# Patient Record
Sex: Female | Born: 1965 | Race: Black or African American | Hispanic: No | Marital: Single | Smoking: Never smoker
Health system: Southern US, Community
[De-identification: ages and names within clinical notes are randomized; demographics above are authoritative.]

## PROBLEM LIST (undated history)

## (undated) DIAGNOSIS — I10 Essential (primary) hypertension: Secondary | ICD-10-CM

## (undated) HISTORY — DX: Essential (primary) hypertension: I10

## (undated) HISTORY — PX: BREAST EXCISIONAL BIOPSY: SUR124

---

## 2013-07-10 HISTORY — PX: TOTAL ABDOMINAL HYSTERECTOMY: SHX209

## 2018-10-14 DIAGNOSIS — I10 Essential (primary) hypertension: Secondary | ICD-10-CM | POA: Insufficient documentation

## 2018-10-14 DIAGNOSIS — E1159 Type 2 diabetes mellitus with other circulatory complications: Secondary | ICD-10-CM | POA: Insufficient documentation

## 2018-10-14 DIAGNOSIS — J309 Allergic rhinitis, unspecified: Secondary | ICD-10-CM | POA: Insufficient documentation

## 2018-10-15 ENCOUNTER — Other Ambulatory Visit: Payer: Self-pay | Admitting: Family

## 2018-10-15 DIAGNOSIS — Z1231 Encounter for screening mammogram for malignant neoplasm of breast: Secondary | ICD-10-CM

## 2018-10-17 DIAGNOSIS — E785 Hyperlipidemia, unspecified: Secondary | ICD-10-CM | POA: Insufficient documentation

## 2018-12-17 ENCOUNTER — Other Ambulatory Visit: Payer: Self-pay

## 2018-12-17 ENCOUNTER — Ambulatory Visit
Admission: RE | Admit: 2018-12-17 | Discharge: 2018-12-17 | Disposition: A | Discharge status: Home / Self Care | Payer: Federal, State, Local not specified - PPO | Source: Ambulatory Visit | Attending: Family | Admitting: Family

## 2018-12-17 DIAGNOSIS — Z1231 Encounter for screening mammogram for malignant neoplasm of breast: Secondary | ICD-10-CM | POA: Insufficient documentation

## 2018-12-30 ENCOUNTER — Other Ambulatory Visit: Payer: Self-pay | Admitting: Family

## 2018-12-30 DIAGNOSIS — N6489 Other specified disorders of breast: Secondary | ICD-10-CM

## 2018-12-30 DIAGNOSIS — R928 Other abnormal and inconclusive findings on diagnostic imaging of breast: Secondary | ICD-10-CM

## 2019-01-06 ENCOUNTER — Ambulatory Visit
Admission: RE | Admit: 2019-01-06 | Discharge: 2019-01-06 | Disposition: A | Discharge status: Home / Self Care | Payer: Federal, State, Local not specified - PPO | Source: Ambulatory Visit | Attending: Family | Admitting: Family

## 2019-01-06 ENCOUNTER — Other Ambulatory Visit: Payer: Self-pay

## 2019-01-06 DIAGNOSIS — R928 Other abnormal and inconclusive findings on diagnostic imaging of breast: Secondary | ICD-10-CM

## 2019-01-06 DIAGNOSIS — N6489 Other specified disorders of breast: Secondary | ICD-10-CM

## 2019-08-14 ENCOUNTER — Ambulatory Visit: Payer: Federal, State, Local not specified - PPO | Attending: Internal Medicine

## 2019-08-14 DIAGNOSIS — Z20822 Contact with and (suspected) exposure to covid-19: Secondary | ICD-10-CM

## 2019-08-15 LAB — NOVEL CORONAVIRUS, NAA: SARS-CoV-2, NAA: NOT DETECTED

## 2020-04-07 ENCOUNTER — Encounter: Payer: Federal, State, Local not specified - PPO | Admitting: Obstetrics & Gynecology

## 2020-05-04 ENCOUNTER — Other Ambulatory Visit: Payer: Self-pay | Admitting: Internal Medicine

## 2020-05-04 DIAGNOSIS — Z1231 Encounter for screening mammogram for malignant neoplasm of breast: Secondary | ICD-10-CM

## 2020-05-07 ENCOUNTER — Encounter: Payer: Self-pay | Admitting: Obstetrics & Gynecology

## 2020-05-07 ENCOUNTER — Other Ambulatory Visit: Payer: Self-pay

## 2020-05-07 ENCOUNTER — Other Ambulatory Visit (HOSPITAL_COMMUNITY)
Admission: RE | Admit: 2020-05-07 | Discharge: 2020-05-07 | Disposition: A | Discharge status: Home / Self Care | Payer: Federal, State, Local not specified - PPO | Source: Ambulatory Visit | Attending: Obstetrics & Gynecology | Admitting: Obstetrics & Gynecology

## 2020-05-07 ENCOUNTER — Ambulatory Visit (INDEPENDENT_AMBULATORY_CARE_PROVIDER_SITE_OTHER): Payer: Federal, State, Local not specified - PPO | Admitting: Obstetrics & Gynecology

## 2020-05-07 VITALS — BP 139/92 | HR 81 | Ht 63.0 in | Wt 232.1 lb

## 2020-05-07 DIAGNOSIS — Z01419 Encounter for gynecological examination (general) (routine) without abnormal findings: Secondary | ICD-10-CM | POA: Diagnosis present

## 2020-05-07 NOTE — Progress Notes (Signed)
Subjective:     Kimberly Benson is a 54 y.o. female here for a routine exam.  Current complaints: Pt moved here from [place]. Her mother is a pt here. She brother and sister both moved to the area. She has 7 nieces here as well. She denies GYN issues. She had a hysterectomy several years ago. Seh does not recall the date or what type of hysterectomy it was. She does know that one of her ovaries was removed. Pt is divorced. She does report hot flushes that are managed conservatively.      Gynecologic History No LMP recorded. Patient has had a hysterectomy. Contraception: status post hysterectomy Last Pap: unknown. Last mammogram: unknown. Has appt for next week.   Obstetric History OB History  Gravida Para Term Preterm AB Living  0 0 0 0 0 0  SAB TAB Ectopic Multiple Live Births  0 0 0 0 0   The following portions of the patient's history were reviewed and updated as appropriate: allergies, current medications, past family history, past medical history, past social history, past surgical history and problem list.  Review of Systems Pertinent items are noted in HPI.    Objective:  BP (!) 139/92   Pulse 81   Ht 5\' 3"  (1.6 m)   Wt 232 lb 1.9 oz (105.3 kg)   BMI 41.12 kg/m  General Appearance:    Alert, cooperative, no distress, appears stated age  Head:    Normocephalic, without obvious abnormality, atraumatic  Eyes:    conjunctiva/corneas clear, EOM's intact, both eyes  Ears:    Normal external ear canals, both ears  Nose:   Nares normal, septum midline, mucosa normal, no drainage    or sinus tenderness  Throat:   Lips, mucosa, and tongue normal; teeth and gums normal  Neck:   Supple, symmetrical, trachea midline, no adenopathy;    thyroid:  no enlargement/tenderness/nodules  Back:     Symmetric, no curvature, ROM normal, no CVA tenderness  Lungs:     respirations unlabored  Chest Wall:    No tenderness or deformity   Heart:    Regular rate and rhythm  Breast Exam:    No  tenderness, masses, or nipple abnormality  Abdomen:     Soft, non-tender, bowel sounds active all four quadrants,    no masses, no organomegaly  Genitalia:    Normal female without lesion, discharge or tenderness   Pt does have a cervix. A PAP was obtained. I do not feel a uterus. No adnexal masses or tenderness palpated.    Extremities:   Extremities normal, atraumatic, no cyanosis or edema  Pulses:   2+ and symmetric all extremities  Skin:   Skin color, texture, turgor normal, no rashes or lesions    Assessment:    Healthy female exam.   S/p hyst but, has intact cervix. Reviewed cervical cancer screening.    Plan:  F/u PAP with hrHPV F/u for mammogram in 1 year.  F/u in 1 year or sooner prn  Willamae Demby L. Harraway-Smith, M.D., 

## 2020-05-07 NOTE — Progress Notes (Signed)
Pt presents for new gyn visit.  Colonoscopy - 3 years ago Mammogram - 05/14/2020 No pregnancies Hysterectomy - 2015?

## 2020-05-12 LAB — CYTOLOGY - PAP
Comment: NEGATIVE
Diagnosis: NEGATIVE
High risk HPV: NEGATIVE

## 2020-06-11 ENCOUNTER — Other Ambulatory Visit: Payer: Self-pay

## 2020-06-11 ENCOUNTER — Ambulatory Visit
Admission: RE | Admit: 2020-06-11 | Discharge: 2020-06-11 | Disposition: A | Discharge status: Home / Self Care | Payer: Federal, State, Local not specified - PPO | Source: Ambulatory Visit | Attending: Internal Medicine | Admitting: Internal Medicine

## 2020-06-11 DIAGNOSIS — Z1231 Encounter for screening mammogram for malignant neoplasm of breast: Secondary | ICD-10-CM | POA: Insufficient documentation

## 2020-07-20 ENCOUNTER — Telehealth: Payer: Self-pay | Admitting: General Practice

## 2020-07-20 NOTE — Telephone Encounter (Signed)
Release of Information was sent to [facility] on 05/07/2020.  Received fax stating "patient has not been at this facility."

## 2020-10-15 ENCOUNTER — Other Ambulatory Visit (HOSPITAL_BASED_OUTPATIENT_CLINIC_OR_DEPARTMENT_OTHER): Payer: Self-pay

## 2020-10-15 MED ORDER — ZOSTER VAC RECOMB ADJUVANTED 50 MCG/0.5ML IM SUSR
INTRAMUSCULAR | 0 refills | Status: DC
  Filled 2020-10-15: qty 0.5, 1d supply, fill #0

## 2020-10-25 ENCOUNTER — Other Ambulatory Visit (HOSPITAL_BASED_OUTPATIENT_CLINIC_OR_DEPARTMENT_OTHER): Payer: Self-pay

## 2020-11-20 DIAGNOSIS — E1165 Type 2 diabetes mellitus with hyperglycemia: Secondary | ICD-10-CM | POA: Insufficient documentation

## 2020-11-20 DIAGNOSIS — E669 Obesity, unspecified: Secondary | ICD-10-CM | POA: Insufficient documentation

## 2020-12-10 ENCOUNTER — Other Ambulatory Visit (HOSPITAL_COMMUNITY): Payer: Self-pay | Admitting: Podiatry

## 2020-12-10 ENCOUNTER — Other Ambulatory Visit: Payer: Self-pay | Admitting: Podiatry

## 2020-12-10 DIAGNOSIS — M25572 Pain in left ankle and joints of left foot: Secondary | ICD-10-CM

## 2020-12-10 DIAGNOSIS — S93402D Sprain of unspecified ligament of left ankle, subsequent encounter: Secondary | ICD-10-CM

## 2020-12-15 ENCOUNTER — Ambulatory Visit
Admission: RE | Admit: 2020-12-15 | Discharge: 2020-12-15 | Disposition: A | Discharge status: Home / Self Care | Payer: Federal, State, Local not specified - PPO | Source: Ambulatory Visit | Attending: Podiatry | Admitting: Podiatry

## 2020-12-15 ENCOUNTER — Other Ambulatory Visit: Payer: Self-pay

## 2020-12-15 DIAGNOSIS — S93402D Sprain of unspecified ligament of left ankle, subsequent encounter: Secondary | ICD-10-CM | POA: Diagnosis present

## 2020-12-15 DIAGNOSIS — M25572 Pain in left ankle and joints of left foot: Secondary | ICD-10-CM | POA: Diagnosis present

## 2021-01-21 ENCOUNTER — Other Ambulatory Visit (HOSPITAL_BASED_OUTPATIENT_CLINIC_OR_DEPARTMENT_OTHER): Payer: Self-pay

## 2021-01-21 MED ORDER — SHINGRIX 50 MCG/0.5ML IM SUSR
INTRAMUSCULAR | 0 refills | Status: DC
  Filled 2021-01-21: qty 1, 1d supply, fill #0

## 2021-05-16 ENCOUNTER — Other Ambulatory Visit: Payer: Self-pay | Admitting: Internal Medicine

## 2021-05-16 DIAGNOSIS — Z1231 Encounter for screening mammogram for malignant neoplasm of breast: Secondary | ICD-10-CM

## 2021-09-19 ENCOUNTER — Other Ambulatory Visit: Payer: Self-pay

## 2021-09-21 ENCOUNTER — Other Ambulatory Visit: Payer: Self-pay

## 2021-09-21 DIAGNOSIS — Z1211 Encounter for screening for malignant neoplasm of colon: Secondary | ICD-10-CM

## 2021-09-21 MED ORDER — CLENPIQ 10-3.5-12 MG-GM -GM/160ML PO SOLN: 1.0000 | Freq: Once | ORAL | 0 refills | Status: AC

## 2021-09-21 NOTE — Progress Notes (Signed)
Gastroenterology Pre-Procedure Review ? ?Request Date: 10/28/2021 ?Requesting Physician: Dr. Marius Ditch ? ?PATIENT REVIEW QUESTIONS: The patient responded to the following health history questions as indicated:   ? ?1. Are you having any GI issues? no ?2. Do you have a personal history of Polyps?  Yes, few polyps removed. Last colonoscopy 5 yrs. Ago in [place] ?3. Do you have a family history of Colon Cancer or Polyps? no ?4. Diabetes Mellitus? no ?5. Joint replacements in the past 12 months?no ?6. Major health problems in the past 3 months?no ?7. Any artificial heart valves, MVP, or defibrillator?no ?   ?MEDICATIONS & ALLERGIES:    ?Patient reports the following regarding taking any anticoagulation/antiplatelet therapy:   ?Plavix, Coumadin, Eliquis, Xarelto, Lovenox, Pradaxa, Brilinta, or Effient? no ?Aspirin? no ? ?Patient confirms/reports the following medications:  ?Current Outpatient Medications  ?Medication Sig Dispense Refill  ? Sod Picosulfate-Mag Ox-Cit Acd (CLENPIQ) 10-3.5-12 MG-GM -GM/160ML SOLN Take 1 kit by mouth once for 1 dose. 320 mL 0  ? ALPRAZolam (XANAX) 0.25 MG tablet     ? amLODipine (NORVASC) 5 MG tablet Take 5 mg by mouth daily.    ? AMLODIPINE BENZOATE PO     ? Cholecalciferol (VITAMIN D3) 1.25 MG (50000 UT) CAPS Take 1 capsule by mouth once a week.    ? cholecalciferol (VITAMIN D3) 25 MCG (1000 UNIT) tablet     ? clindamycin-benzoyl peroxide (BENZACLIN) gel     ? desvenlafaxine (PRISTIQ) 50 MG 24 hr tablet Take 50 mg by mouth daily.    ? FLUTICASONE PROPIONATE EX     ? nystatin (MYCOSTATIN/NYSTOP) powder SMARTSIG:1 Topical Every Night    ? OLMESARTAN-AMLODIPINE-HCTZ PO     ? rosuvastatin (CRESTOR) 20 MG tablet Take 20 mg by mouth at bedtime.    ? rosuvastatin (CRESTOR) 40 MG tablet Take 40 mg by mouth daily.    ? Rosuvastatin Calcium 20 MG CPSP     ? Zoster Vaccine Adjuvanted [facility]) injection Inject into the muscle. 0.5 mL 0  ? Zoster Vaccine Adjuvanted [facility]) injection Inject into the muscle.  1 each 0  ? ?No current facility-administered medications for this visit.  ? ? ?Patient confirms/reports the following allergies:  ?No Known Allergies ? ?Orders Placed This Encounter  ?Procedures  ? Procedural/ Surgical Case Request: COLONOSCOPY WITH PROPOFOL  ?  Standing Status:   Standing  ?  Number of Occurrences:   1  ?  Order Specific Question:   Pre-op diagnosis  ?  Answer:   Colon cancer screening Z12.11  ?  Order Specific Question:   CPT Code  ?  Answer:   PR COLONOSCOPY FLX DX W/COLLJ SPEC WHEN PFRMD [45378]  ? ? ?AUTHORIZATION INFORMATION ?Primary Insurance: ?1D#: ?Group #: ? ?Secondary Insurance: ?1D#: ?Group #: ? ?SCHEDULE INFORMATION: ?Date: 10/28/2021 ?Time: ?Location: [facility] ? ?

## 2021-10-27 ENCOUNTER — Other Ambulatory Visit: Payer: Self-pay

## 2021-10-27 DIAGNOSIS — Z1211 Encounter for screening for malignant neoplasm of colon: Secondary | ICD-10-CM

## 2021-11-25 ENCOUNTER — Encounter: Payer: Self-pay | Admitting: Gastroenterology

## 2021-11-25 ENCOUNTER — Ambulatory Visit: Payer: Federal, State, Local not specified - PPO | Admitting: Certified Registered"

## 2021-11-25 ENCOUNTER — Encounter
Admission: RE | Disposition: A | Discharge status: Home / Self Care | Payer: Self-pay | Source: Ambulatory Visit | Attending: Gastroenterology

## 2021-11-25 ENCOUNTER — Ambulatory Visit
Admission: RE | Admit: 2021-11-25 | Discharge: 2021-11-25 | Disposition: A | Discharge status: Home / Self Care | Payer: Federal, State, Local not specified - PPO | Source: Ambulatory Visit | Attending: Gastroenterology | Admitting: Gastroenterology

## 2021-11-25 DIAGNOSIS — Z1211 Encounter for screening for malignant neoplasm of colon: Secondary | ICD-10-CM | POA: Diagnosis present

## 2021-11-25 DIAGNOSIS — I10 Essential (primary) hypertension: Secondary | ICD-10-CM | POA: Diagnosis not present

## 2021-11-25 HISTORY — PX: COLONOSCOPY WITH PROPOFOL: SHX5780

## 2021-11-25 LAB — GLUCOSE, CAPILLARY: Glucose-Capillary: 120 mg/dL — ABNORMAL HIGH (ref 70–99)

## 2021-11-25 SURGERY — COLONOSCOPY WITH PROPOFOL
Anesthesia: General

## 2021-11-25 MED ORDER — PROPOFOL 500 MG/50ML IV EMUL
INTRAVENOUS | Status: DC | PRN
Start: 2021-11-25 — End: 2021-11-25

## 2021-11-25 MED ORDER — SODIUM CHLORIDE 0.9 % IV SOLN
INTRAVENOUS | Status: DC
  Administered 2021-11-25: 1000 mL via INTRAVENOUS

## 2021-11-25 MED ORDER — PROPOFOL 10 MG/ML IV BOLUS
INTRAVENOUS | Status: DC | PRN
Start: 2021-11-25 — End: 2021-11-25

## 2021-11-25 MED ORDER — PROPOFOL 500 MG/50ML IV EMUL
INTRAVENOUS | Status: AC
  Filled 2021-11-25: qty 50

## 2021-11-25 MED ORDER — PROPOFOL 10 MG/ML IV BOLUS
INTRAVENOUS | Status: DC | PRN
  Administered 2021-11-25: 140 ug/kg/min via INTRAVENOUS

## 2021-11-25 MED ORDER — PROPOFOL 500 MG/50ML IV EMUL
INTRAVENOUS | Status: DC | PRN
  Administered 2021-11-25: 50 mg via INTRAVENOUS

## 2021-11-25 NOTE — Anesthesia Procedure Notes (Signed)
Date/Time: 11/25/2021 12:07 PM Performed by: Berniece Pap, CRNA Pre-anesthesia Checklist: Patient identified, Emergency Drugs available, Suction available and Patient being monitored Patient Re-evaluated:Patient Re-evaluated prior to induction Oxygen Delivery Method: Simple face mask Induction Type: IV induction

## 2021-11-25 NOTE — H&P (Signed)
Arlyss Repress, MD [address]  [address]  [place], [place] [address]  Main: [phone]  Fax: [phone] Pager: [phone]  Primary Care Physician:  Lyndon Code, MD Primary Gastroenterologist:  Dr. Arlyss Repress  Pre-Procedure History & Physical: HPI:  Kimberly Benson is a 56 y.o. female is here for an colonoscopy.   Past Medical History:  Diagnosis Date   Hypertension     Past Surgical History:  Procedure Laterality Date   BREAST EXCISIONAL BIOPSY Left 56 yrs old   benign   TOTAL ABDOMINAL HYSTERECTOMY  2015    Prior to Admission medications   Medication Sig Start Date End Date Taking? Authorizing Provider  ALPRAZolam Prudy Feeler) 0.25 MG tablet    Yes [provider]  amLODipine (NORVASC) 5 MG tablet Take 5 mg by mouth daily. 04/19/20  Yes [provider]  AMLODIPINE BENZOATE PO    Yes [provider]  Cholecalciferol (VITAMIN D3) 1.25 MG (50000 UT) CAPS Take 1 capsule by mouth once a week. 04/16/20  Yes [provider]  cholecalciferol (VITAMIN D3) 25 MCG (1000 UNIT) tablet    Yes [provider]  clindamycin-benzoyl peroxide (BENZACLIN) gel  04/14/20  Yes [provider]  desvenlafaxine (PRISTIQ) 50 MG 24 hr tablet Take 50 mg by mouth daily. 04/05/20  Yes [provider]  nystatin (MYCOSTATIN/NYSTOP) powder SMARTSIG:1 Topical Every Night 01/23/20  Yes [provider]  OLMESARTAN-AMLODIPINE-HCTZ PO    Yes [provider]  rosuvastatin (CRESTOR) 20 MG tablet Take 20 mg by mouth at bedtime. 01/23/20  Yes [provider]  FLUTICASONE PROPIONATE EX     [provider]  rosuvastatin (CRESTOR) 40 MG tablet Take 40 mg by mouth daily. 04/22/20   [provider]  Rosuvastatin Calcium 20 MG CPSP     [provider]  Zoster Vaccine Adjuvanted [facility]) injection Inject into the muscle. 10/15/20   Judyann Munson, MD  Zoster Vaccine Adjuvanted [facility]) injection  Inject into the muscle. 01/21/21   Judyann Munson, MD    Allergies as of 09/21/2021   (No Known Allergies)    Family History  Family history unknown: Yes    Social History   Socioeconomic History   Marital status: Single    Spouse name: Not on file   Number of children: Not on file   Years of education: Not on file   Highest education level: Not on file  Occupational History   Not on file  Tobacco Use   Smoking status: Never   Smokeless tobacco: Never  Vaping Use   Vaping Use: Never used  Substance and Sexual Activity   Alcohol use: Yes    Comment: Social    Drug use: Never   Sexual activity: Yes    Birth control/protection: None  Other Topics Concern   Not on file  Social History Narrative   Not on file   Social Determinants of Health   Financial Resource Strain: Not on file  Food Insecurity: Not on file  Transportation Needs: Not on file  Physical Activity: Not on file  Stress: Not on file  Social Connections: Not on file  Intimate Partner Violence: Not on file    Review of Systems: See HPI, otherwise negative ROS  Physical Exam: BP (!) 158/94   Pulse 86   Temp (!) 97.5 F (36.4 C) (Temporal)   Resp 18   Ht 5\' 3"  (1.6 m)   Wt 102.3 kg   SpO2 100%   BMI  39.94 kg/m  General:   Alert,  pleasant and cooperative in NAD Head:  Normocephalic and atraumatic. Neck:  Supple; no masses or thyromegaly. Lungs:  Clear throughout to auscultation.    Heart:  Regular rate and rhythm. Abdomen:  Soft, nontender and nondistended. Normal bowel sounds, without guarding, and without rebound.   Neurologic:  Alert and  oriented x4;  grossly normal neurologically.  Impression/Plan: Kimberly Benson is here for an colonoscopy to be performed for colon cancer screening  Risks, benefits, limitations, and alternatives regarding  colonoscopy have been reviewed with the patient.  Questions have been answered.  All parties agreeable.   Lannette Donath, MD  11/25/2021, 11:33  AM

## 2021-11-25 NOTE — Anesthesia Preprocedure Evaluation (Signed)
Anesthesia Evaluation  Patient identified by MRN, date of birth, ID band Patient awake    Reviewed: Allergy & Precautions, NPO status , Patient's Chart, lab work & pertinent test results  History of Anesthesia Complications Negative for: history of anesthetic complications  Airway Mallampati: III  TM Distance: >3 FB Neck ROM: full    Dental  (+) Chipped   Pulmonary neg pulmonary ROS, neg shortness of breath,    Pulmonary exam normal        Cardiovascular hypertension, (-) anginaNormal cardiovascular exam     Neuro/Psych negative neurological ROS  negative psych ROS   GI/Hepatic negative GI ROS, Neg liver ROS,   Endo/Other  negative endocrine ROS  Renal/GU negative Renal ROS  negative genitourinary   Musculoskeletal   Abdominal   Peds  Hematology negative hematology ROS (+)   Anesthesia Other Findings Past Medical History: No date: Hypertension  Past Surgical History: 56 yrs old: BREAST EXCISIONAL BIOPSY; Left     Comment:  benign 2015: TOTAL ABDOMINAL HYSTERECTOMY  BMI    Body Mass Index: 39.94 kg/m      Reproductive/Obstetrics negative OB ROS                             Anesthesia Physical Anesthesia Plan  ASA: 3  Anesthesia Plan: General   Post-op Pain Management:    Induction: Intravenous  PONV Risk Score and Plan: Propofol infusion and TIVA  Airway Management Planned: Natural Airway and Nasal Cannula  Additional Equipment:   Intra-op Plan:   Post-operative Plan:   Informed Consent: I have reviewed the patients History and Physical, chart, labs and discussed the procedure including the risks, benefits and alternatives for the proposed anesthesia with the patient or authorized representative who has indicated his/her understanding and acceptance.     Dental Advisory Given  Plan Discussed with: Anesthesiologist, CRNA and Surgeon  Anesthesia Plan Comments:  (Patient consented for risks of anesthesia including but not limited to:  - adverse reactions to medications - risk of airway placement if required - damage to eyes, teeth, lips or other oral mucosa - nerve damage due to positioning  - sore throat or hoarseness - Damage to heart, brain, nerves, lungs, other parts of body or loss of life  Patient voiced understanding.)        Anesthesia Quick Evaluation

## 2021-11-25 NOTE — Op Note (Signed)
[facility] Gastroenterology Patient Name: Kimberly Benson Procedure Date: 11/25/2021 11:43 AM MRN: [id number] Account #: [id number] Date of Birth: 1966/04/11 Admit Type: Outpatient Age: 56 Room: [facility] ENDO ROOM 2 Gender: Female Note Status: Finalized Instrument Name: Colonscope [id number] Procedure:             Colonoscopy Indications:           Screening for colorectal malignant neoplasm Providers:             Toney Reil MD, MD Referring MD:          Lyndon Code, MD (Referring MD) Medicines:             General Anesthesia Complications:         No immediate complications. Estimated blood loss: None. Procedure:             Pre-Anesthesia Assessment:                        - Prior to the procedure, a History and Physical was                         performed, and patient medications and allergies were                         reviewed. The patient is competent. The risks and                         benefits of the procedure and the sedation options and                         risks were discussed with the patient. All questions                         were answered and informed consent was obtained.                         Patient identification and proposed procedure were                         verified by the physician, the nurse, the                         anesthesiologist, the anesthetist and the technician                         in the pre-procedure area in the procedure room in the                         endoscopy suite. Mental Status Examination: alert and                         oriented. Airway Examination: normal oropharyngeal                         airway and neck mobility. Respiratory Examination:                         clear to auscultation. CV Examination: normal.  Prophylactic Antibiotics: The patient does not require                         prophylactic antibiotics. Prior Anticoagulants: The                          patient has taken no previous anticoagulant or                         antiplatelet agents. ASA Grade Assessment: III - A                         patient with severe systemic disease. After reviewing                         the risks and benefits, the patient was deemed in                         satisfactory condition to undergo the procedure. The                         anesthesia plan was to use general anesthesia.                         Immediately prior to administration of medications,                         the patient was re-assessed for adequacy to receive                         sedatives. The heart rate, respiratory rate, oxygen                         saturations, blood pressure, adequacy of pulmonary                         ventilation, and response to care were monitored                         throughout the procedure. The physical status of the                         patient was re-assessed after the procedure.                        After obtaining informed consent, the colonoscope was                         passed under direct vision. Throughout the procedure,                         the patient's blood pressure, pulse, and oxygen                         saturations were monitored continuously. The                         Colonoscope was introduced through the anus and  advanced to the the cecum, identified by appendiceal                         orifice and ileocecal valve. The colonoscopy was                         performed without difficulty. The patient tolerated                         the procedure well. The quality of the bowel                         preparation was evaluated using the BBPS [facility] Bowel                         Preparation Scale) with scores of: Right Colon = 3,                         Transverse Colon = 3 and Left Colon = 3 (entire mucosa                         seen well with no residual staining, small fragments                          of stool or opaque liquid). The total BBPS score                         equals 9. Findings:      The perianal and digital rectal examinations were normal. Pertinent       negatives include normal sphincter tone and no palpable rectal lesions.      The entire examined colon appeared normal.      The retroflexed view of the distal rectum and anal verge was normal and       showed no anal or rectal abnormalities. Impression:            - The entire examined colon is normal.                        - The distal rectum and anal verge are normal on                         retroflexion view.                        - No specimens collected. Recommendation:        - Discharge patient to home (with escort).                        - Resume previous diet today.                        - Continue present medications.                        - Repeat colonoscopy in 10 years for screening                         purposes. Procedure Code(s):     ---  Professional ---                        G4010, Colorectal cancer screening; colonoscopy on                         individual not meeting criteria for high risk Diagnosis Code(s):     --- Professional ---                        Z12.11, Encounter for screening for malignant neoplasm                         of colon CPT copyright 2019 American Medical Association. All rights reserved. The codes documented in this report are preliminary and upon coder review may  be revised to meet current compliance requirements. Dr. Libby Maw Toney Reil MD, MD 11/25/2021 12:12:31 PM This report has been signed electronically. Number of Addenda: 0 Note Initiated On: 11/25/2021 11:43 AM Scope Withdrawal Time: 0 hours 5 minutes 58 seconds  Total Procedure Duration: 0 hours 10 minutes 26 seconds  Estimated Blood Loss:  Estimated blood loss: none.      [facility]

## 2021-11-25 NOTE — Transfer of Care (Signed)
Immediate Anesthesia Transfer of Care Note  Patient: Kimberly Benson  Procedure(s) Performed: COLONOSCOPY WITH PROPOFOL  Patient Location: Endoscopy Unit  Anesthesia Type:General  Level of Consciousness: awake, alert  and oriented  Airway & Oxygen Therapy: Patient Spontanous Breathing  Post-op Assessment: Report given to RN and Post -op Vital signs reviewed and stable  Post vital signs: Reviewed and stable  Last Vitals:  Vitals Value Taken Time  BP 125/75 11/25/21 1217  Temp    Pulse 84 11/25/21 1219  Resp 17 11/25/21 1219  SpO2 100 % 11/25/21 1219  Vitals shown include unvalidated device data.  Last Pain:  Vitals:   11/25/21 1217  TempSrc:   PainSc: 0-No pain         Complications: No notable events documented.

## 2021-11-28 NOTE — Anesthesia Postprocedure Evaluation (Signed)
Anesthesia Post Note  Patient: EDYE HAINLINE  Procedure(s) Performed: COLONOSCOPY WITH PROPOFOL  Patient location during evaluation: Endoscopy Anesthesia Type: General Level of consciousness: awake and alert Pain management: pain level controlled Vital Signs Assessment: post-procedure vital signs reviewed and stable Respiratory status: spontaneous breathing, nonlabored ventilation, respiratory function stable and patient connected to nasal cannula oxygen Cardiovascular status: blood pressure returned to baseline and stable Postop Assessment: no apparent nausea or vomiting Anesthetic complications: no   No notable events documented.   Last Vitals:  Vitals:   11/25/21 1227 11/25/21 1237  BP: 122/77 135/79  Pulse: 79 69  Resp: (!) 21 (!) 21  Temp:    SpO2: 100% 98%    Last Pain:  Vitals:   11/27/21 1523  TempSrc:   PainSc: 0-No pain                 Cleda Mccreedy Genieve Ramaswamy

## 2021-11-29 ENCOUNTER — Ambulatory Visit
Admission: RE | Admit: 2021-11-29 | Discharge: 2021-11-29 | Disposition: A | Discharge status: Home / Self Care | Payer: Federal, State, Local not specified - PPO | Source: Ambulatory Visit | Attending: Internal Medicine | Admitting: Internal Medicine

## 2021-11-29 DIAGNOSIS — Z1231 Encounter for screening mammogram for malignant neoplasm of breast: Secondary | ICD-10-CM | POA: Diagnosis present

## 2022-01-06 ENCOUNTER — Ambulatory Visit (INDEPENDENT_AMBULATORY_CARE_PROVIDER_SITE_OTHER): Payer: Federal, State, Local not specified - PPO

## 2022-01-06 ENCOUNTER — Other Ambulatory Visit (HOSPITAL_COMMUNITY)
Admission: RE | Admit: 2022-01-06 | Discharge: 2022-01-06 | Disposition: A | Discharge status: Home / Self Care | Payer: Federal, State, Local not specified - PPO | Source: Ambulatory Visit | Attending: Family Medicine | Admitting: Family Medicine

## 2022-01-06 VITALS — BP 141/67 | HR 76

## 2022-01-06 DIAGNOSIS — B3731 Acute candidiasis of vulva and vagina: Secondary | ICD-10-CM | POA: Insufficient documentation

## 2022-01-06 MED ORDER — FLUCONAZOLE 150 MG PO TABS: 150.0000 mg | ORAL_TABLET | Freq: Once | ORAL | 0 refills | Status: AC

## 2022-01-06 MED ORDER — TERCONAZOLE 0.8 % VA CREA: 1.0000 | TOPICAL_CREAM | Freq: Every day | VAGINAL | 0 refills | Status: DC

## 2022-01-06 NOTE — Progress Notes (Cosign Needed)
SUBJECTIVE:  56 y.o. female complains of white vaginal discharge for 1 week(s). Reports vaginal itching and irritation. Denies abnormal vaginal bleeding or significant pelvic pain or fever. No UTI symptoms. Denies history of known exposure to STD.  No LMP recorded. Patient has had a hysterectomy.  OBJECTIVE:  She appears well, afebrile. Urine dipstick: not done.  ASSESSMENT:  Vaginal Discharge  Vaginal Odor   PLAN:  GC, chlamydia, trichomonas, BVAG, CVAG probe sent to lab. Treatment: To be determined once lab results are received ROV prn if symptoms persist or worsen.

## 2022-01-06 NOTE — Progress Notes (Signed)
Chart reviewed - agree with CMA/RN documentation.  ° °

## 2022-01-09 LAB — CERVICOVAGINAL ANCILLARY ONLY
Bacterial Vaginitis (gardnerella): NEGATIVE
Candida Glabrata: NEGATIVE
Candida Vaginitis: POSITIVE — AB
Comment: NEGATIVE
Comment: NEGATIVE
Comment: NEGATIVE

## 2022-01-11 MED ORDER — FLUCONAZOLE 150 MG PO TABS: 150.0000 mg | ORAL_TABLET | Freq: Once | ORAL | 3 refills | Status: AC

## 2022-01-11 NOTE — Addendum Note (Signed)
Addended by: Levie Heritage on: 01/11/2022 04:58 PM   Modules accepted: Orders

## 2022-03-22 ENCOUNTER — Ambulatory Visit: Payer: Federal, State, Local not specified - PPO | Admitting: Obstetrics & Gynecology

## 2022-05-12 ENCOUNTER — Other Ambulatory Visit (HOSPITAL_COMMUNITY)
Admission: RE | Admit: 2022-05-12 | Discharge: 2022-05-12 | Disposition: A | Discharge status: Home / Self Care | Payer: Federal, State, Local not specified - PPO | Source: Ambulatory Visit | Attending: Obstetrics and Gynecology | Admitting: Obstetrics and Gynecology

## 2022-05-12 ENCOUNTER — Encounter: Payer: Self-pay | Admitting: Obstetrics and Gynecology

## 2022-05-12 ENCOUNTER — Ambulatory Visit (INDEPENDENT_AMBULATORY_CARE_PROVIDER_SITE_OTHER): Payer: Federal, State, Local not specified - PPO | Admitting: Obstetrics and Gynecology

## 2022-05-12 VITALS — BP 111/71 | HR 89 | Wt 217.0 lb

## 2022-05-12 DIAGNOSIS — Z01419 Encounter for gynecological examination (general) (routine) without abnormal findings: Secondary | ICD-10-CM

## 2022-05-12 DIAGNOSIS — N898 Other specified noninflammatory disorders of vagina: Secondary | ICD-10-CM

## 2022-05-12 MED ORDER — FLUCONAZOLE 150 MG PO TABS: 150.0000 mg | ORAL_TABLET | Freq: Once | ORAL | 1 refills | Status: AC

## 2022-05-12 NOTE — Progress Notes (Signed)
ANNUAL EXAM Patient name: Kimberly Benson MRN [id number]  Date of birth: May 25, 1966 Chief Complaint:   Gynecologic Exam  History of Present Illness:   Kimberly Benson is a 56 y.o. G0P0000  being seen today for a routine annual exam. She reports having white vaginal discharge with itching x1 week. Uses a razor to shave typically. Has had 1 yeast infection earlier this year but does not typically have yeast or UTIs. She is not currently sexually active. She has used a different laundry detergent but this is has been the case for a while. Not usually wearing cotton underwear but has purchased more and will switch. No increase in physcial activity or sweating; has started ozempic. No vaginal bleeding.    No LMP recorded. Patient has had a hysterectomy.  Last pap 04/2020. Results were: NILM w/ HRHPV negative.  Last colonoscopy: 11/2021. Results were: normal.       No data to display               No data to display           Review of Systems:   Pertinent items are noted in HPI Denies any headaches, blurred vision, fatigue, shortness of breath, chest pain, abdominal pain, abnormal vaginal discharge/itching/odor/irritation, problems with periods, bowel movements, urination, or intercourse unless otherwise stated above. Pertinent History Reviewed:  Reviewed past medical,surgical, social and family history.  Reviewed problem list, medications and allergies. Physical Assessment:   Vitals:   05/12/22 0946  BP: 111/71  Pulse: 89  Weight: 217 lb (98.4 kg)  Body mass index is 38.44 kg/m.        Physical Examination:   General appearance - well appearing, and in no distress  Mental status - alert, oriented to person, place, and time  Psych:  She has a normal mood and affect  Skin - warm and dry, normal color, no suspicious lesions noted  Chest - effort normal, all lung fields clear to auscultation bilaterally  Heart - normal rate and regular rhythm  Breasts - breasts appear  normal, no suspicious masses, no skin or nipple changes or  axillary nodes  Abdomen - soft, nontender, nondistended, no masses or organomegaly  Pelvic -  VULVA: normal appearing vulva with no masses, tenderness or lesions   VAGINA: normal appearing vagina with normal color, small amount of white discharge, no blood, no lesions or masses  CERVIX: normal appearing cervix without discharge or lesions  Extremities:  No swelling or varicosities noted  Chaperone present for exam  No results found for this or any previous visit (from the past 24 hour(s)).   Assessment & Plan:  1. Well woman exam with routine gynecological exam Completed routine examination. UTD on pap, colonoscopy and mammogram.   2. Vaginal discharge Rx sent for yeast in case of positive swab (going out of town this weekend). Unclear etiology, possible side effect of ozempic though not listed as a common side effect. If yeast becomes recurrent will need to investigate cause further and consider suppressive therapy.  - Cervicovaginal ancillary only( [place]) - fluconazole (DIFLUCAN) 150 MG tablet; Take 1 tablet (150 mg total) by mouth once for 1 dose.  Dispense: 1 tablet; Refill: 1   No orders of the defined types were placed in this encounter.   Meds:  Meds ordered this encounter  Medications   fluconazole (DIFLUCAN) 150 MG tablet    Sig: Take 1 tablet (150 mg total) by mouth once for 1 dose.  Dispense:  1 tablet    Refill:  1    Follow-up: Return for Annual GYN.  Lorriane Shire, MD 05/12/2022 11:09 AM

## 2022-05-12 NOTE — Progress Notes (Signed)
Patient states that she thinks she may have a yeast infection. Kathrene Alu RN

## 2022-05-12 NOTE — Patient Instructions (Addendum)
I will send an rx refill for fluconazole, if test positive you go ahead and take the medication.   It does not look like yeast infection is a typical side effect of ozempic... If you continue to have yeast infections, we will investigate further.

## 2022-05-13 ENCOUNTER — Ambulatory Visit
Admission: EM | Admit: 2022-05-13 | Discharge: 2022-05-13 | Disposition: A | Discharge status: Home / Self Care | Payer: Federal, State, Local not specified - PPO | Attending: Family Medicine | Admitting: Family Medicine

## 2022-05-13 ENCOUNTER — Encounter: Payer: Self-pay | Admitting: Emergency Medicine

## 2022-05-13 ENCOUNTER — Ambulatory Visit (INDEPENDENT_AMBULATORY_CARE_PROVIDER_SITE_OTHER): Payer: Federal, State, Local not specified - PPO

## 2022-05-13 DIAGNOSIS — M79644 Pain in right finger(s): Secondary | ICD-10-CM

## 2022-05-13 DIAGNOSIS — S6701XA Crushing injury of right thumb, initial encounter: Secondary | ICD-10-CM | POA: Diagnosis not present

## 2022-05-13 MED ORDER — NAPROXEN 500 MG PO TABS: 500.0000 mg | ORAL_TABLET | Freq: Two times a day (BID) | ORAL | 0 refills | Status: DC

## 2022-05-13 MED ORDER — KETOROLAC TROMETHAMINE 60 MG/2ML IM SOLN
30.0000 mg | Freq: Once | INTRAMUSCULAR | Status: AC
  Administered 2022-05-13: 30 mg via INTRAMUSCULAR

## 2022-05-13 NOTE — ED Provider Notes (Signed)
[facility]    CSN: [id number] Arrival date & time: 05/13/22  1055      History   Chief Complaint Chief Complaint  Patient presents with   Finger Injury    Right Thumb    HPI  HPI Kimberly Benson is a 56 y.o. female.   Kimberly Benson presents for ***  ***Injury occurred, mechanism and which ***shoulder injured.  Reports *** immediate pain in his shoulder ***. Did ***not hear any pop or abnormal sounds with his injury.  Continues to have *** description pain when moving his arm forward. Denies ***redness, swelling. Feels like his/her*** arm is ***not weak.  Has ***never injured this shoulder before.  ***He/She is ***right handed.  Has tried *** with little relief.  ***No change in pain day vs night.    Fever : no  Sore throat: no   Cough: no Appetite: normal  Hydration: normal  Abdominal pain: no Nausea: no Vomiting: no Sleep disturbance: no *** Back Pain: no Headache: no     Past Medical History:  Diagnosis Date   Hypertension     Patient Active Problem List   Diagnosis Date Noted   Colon cancer screening     Past Surgical History:  Procedure Laterality Date   BREAST EXCISIONAL BIOPSY Left 56 yrs old   benign   COLONOSCOPY WITH PROPOFOL N/A 11/25/2021   Procedure: COLONOSCOPY WITH PROPOFOL;  Surgeon: Toney Reil, MD;  Location: [facility] ENDOSCOPY;  Service: Gastroenterology;  Laterality: N/A;   TOTAL ABDOMINAL HYSTERECTOMY  2015   supracervical    OB History     Gravida  0   Para  0   Term  0   Preterm  0   AB  0   Living  0      SAB  0   IAB  0   Ectopic  0   Multiple  0   Live Births  0            Home Medications    Prior to Admission medications   Medication Sig Start Date End Date Taking? Authorizing Provider  ALPRAZolam Prudy Feeler) 0.25 MG tablet     [provider]  amLODipine (NORVASC) 5 MG tablet Take 5 mg by mouth daily. 04/19/20   [provider]  AMLODIPINE BENZOATE PO     [provider]  Cholecalciferol (VITAMIN D3) 1.25 MG (50000 UT) CAPS Take 1 capsule by mouth once a week. 04/16/20   [provider]  cholecalciferol (VITAMIN D3) 25 MCG (1000 UNIT) tablet     [provider]  clindamycin-benzoyl peroxide (BENZACLIN) gel  04/14/20   [provider]  desvenlafaxine (PRISTIQ) 50 MG 24 hr tablet Take 50 mg by mouth daily. Patient not taking: Reported on 05/12/2022 04/05/20   [provider]  FLUTICASONE PROPIONATE EX     [provider]  nystatin (MYCOSTATIN/NYSTOP) powder SMARTSIG:1 Topical Every Night 01/23/20   [provider]  OLMESARTAN-AMLODIPINE-HCTZ PO     [provider]  OZEMPIC, 1 MG/DOSE, 4 MG/3ML SOPN SMARTSIG:1 Milligram(s) Topical Once a Week 04/17/22   [provider]  rosuvastatin (CRESTOR) 20 MG tablet Take 20 mg by mouth at bedtime. 01/23/20   [provider]  terconazole (TERAZOL 3) 0.8 % vaginal cream Place 1 applicator vaginally at bedtime. Apply nightly for three nights. Patient not taking: Reported on 05/12/2022 01/06/22   Levie Heritage, DO  Zoster Vaccine Adjuvanted [facility]) injection Inject into the muscle. 10/15/20  Judyann Munson, MD  Zoster Vaccine Adjuvanted [facility]) injection Inject into the muscle. 01/21/21   Judyann Munson, MD    Family History Family History  Problem Relation Age of Onset   Breast cancer Neg Hx     Social History Social History   Tobacco Use   Smoking status: Never   Smokeless tobacco: Never  Vaping Use   Vaping Use: Never used  Substance Use Topics   Alcohol use: Yes    Comment: Social    Drug use: Never     Allergies   Patient has no known allergies.   Review of Systems Review of Systems: :negative unless otherwise stated in HPI.      Physical Exam Triage Vital Signs ED Triage Vitals  Enc Vitals Group     BP 05/13/22 1120 109/77     Pulse Rate 05/13/22 1120 71     Resp 05/13/22 1120 14     Temp 05/13/22  1120 98.1 F (36.7 C)     Temp Source 05/13/22 1120 Oral     SpO2 05/13/22 1120 96 %     Weight 05/13/22 1117 217 lb (98.4 kg)     Height 05/13/22 1117 5\' 3"  (1.6 m)     Head Circumference --      Peak Flow --      Pain Score 05/13/22 1117 7     Pain Loc --      Pain Edu? --      Excl. in GC? --    No data found.  Updated Vital Signs BP 109/77 (BP Location: Left Arm)   Pulse 71   Temp 98.1 F (36.7 C) (Oral)   Resp 14   Ht 5\' 3"  (1.6 m)   Wt 98.4 kg   SpO2 96%   BMI 38.44 kg/m   Visual Acuity Right Eye Distance:   Left Eye Distance:   Bilateral Distance:    Right Eye Near:   Left Eye Near:    Bilateral Near:     Physical Exam GEN: well appearing female in no acute distress  CVS: well perfused  RESP: speaking in full sentences without pause, no respiratory distress  MSK: *** shoulder:  No evidence of bony deformity, asymmetry, or muscle atrophy. No tenderness over long head of biceps (bicipital groove).  [facility].  Full active and passive (ABD, ADD, Flexion, extension, IR, ER). Limited *** 2/2 to pain.  Strength 5/5 grip, elbow and shoulder. No abnormal scapular function observed.  Special Tests: : ***; Empty Can: ***, Neer's: Negative; Painful arc: Negative; Anterior Apprehension: Negative Sensation intact. Peripheral pulses intact.   UC Treatments / Results  Labs (all labs ordered are listed, but only abnormal results are displayed) Labs Reviewed - No data to display  EKG   Radiology DG Finger Thumb Right  Result Date: 05/13/2022 CLINICAL DATA:  Right thumb pain.  Closed in car door EXAM: RIGHT THUMB 2+V COMPARISON:  None Available. FINDINGS: There is no evidence of fracture or dislocation. There is no evidence of arthropathy or other focal bone abnormality. Soft tissues are unremarkable. IMPRESSION: Negative. Electronically Signed   By: Juanetta Gosling D.O.   On: 05/13/2022 11:51    Procedures Procedures (including critical care  time)  Medications Ordered in UC Medications - No data to display  Initial Impression / Assessment and Plan / UC Course  I have reviewed the triage vital signs and the nursing notes.  Pertinent labs & imaging results that were available  during my care of the patient were reviewed by me and considered in my medical decision making (see chart for details).     ***  Pt is a 56 y.o.  female with *** days of *** shoulder pain after ***.   Exam is concerning for ***.  Obtained *** shoulder films.  Personally reviewed by me were ***unremarkable for fracture or dislocation.     Patient to gradually return to normal activities, as tolerated and continue ordinary activities within the limits permitted by pain. Prescribed Naproxen sodium *** and muscle relaxer *** for pain relief.  Tylenol PRN. Advised patient to avoid other NSAIDs while taking ***Naprosyn. Counseled patient on red flag symptoms and when to seek immediate care.  ***No red flags such as progressive major motor weakness.   Patient to follow up with orthopedic provider if symptoms do not improve with conservative treatment.  Return and ED precautions given.   Discussed MDM, treatment plan and plan for follow-up with patient/parent who agrees with plan.   Final Clinical Impressions(s) / UC Diagnoses   Final diagnoses:  None   Discharge Instructions   None    ED Prescriptions   None    PDMP not reviewed this encounter.

## 2022-05-13 NOTE — ED Triage Notes (Signed)
Patient states that she closed that the car door on her right thumb today.  Patient reports pain and swelling in her right thumb.

## 2022-05-13 NOTE — Discharge Instructions (Signed)
If medication was prescribed, stop by the pharmacy to pick up your prescriptions.  For your  pain, Take 1500 mg Tylenol twice a day, take  Naprosyn twice a day,  as needed for pain.  We applied a compressive ACE bandage to help support your thumb. Rest and elevate the affected painful area.  Apply cold/heat compresses intermittently, as needed.  As pain recedes, begin normal activities slowly as tolerated.  Follow up with primary care provider or an orthopedic provider, if symptoms persist.  Watch for worsening symptoms such as an increasing weakness or loss of sensation, increasing pain and/or the loss of bladder or bowel function. Should any of these occur, go to the emergency department immediately.

## 2022-05-15 LAB — CERVICOVAGINAL ANCILLARY ONLY
Bacterial Vaginitis (gardnerella): NEGATIVE
Candida Glabrata: NEGATIVE
Candida Vaginitis: POSITIVE — AB
Comment: NEGATIVE
Comment: NEGATIVE
Comment: NEGATIVE

## 2022-05-16 ENCOUNTER — Other Ambulatory Visit: Payer: Self-pay

## 2022-05-16 DIAGNOSIS — B379 Candidiasis, unspecified: Secondary | ICD-10-CM

## 2022-05-16 MED ORDER — FLUCONAZOLE 150 MG PO TABS: 150.0000 mg | ORAL_TABLET | Freq: Once | ORAL | 0 refills | Status: AC

## 2022-05-16 NOTE — Progress Notes (Signed)
Patient tested positive for yeast. Diflucan 150 mg 1 tablet PO once was sent to her pharmacy. A Mychart message will be sent to patient.  Dima Ferrufino l Zoei Amison, CMA

## 2022-09-30 ENCOUNTER — Other Ambulatory Visit: Payer: Self-pay | Admitting: Family

## 2022-11-10 ENCOUNTER — Ambulatory Visit: Payer: Federal, State, Local not specified - PPO | Admitting: Family

## 2022-11-10 ENCOUNTER — Other Ambulatory Visit: Payer: Self-pay | Admitting: Family

## 2022-11-10 VITALS — BP 124/78 | HR 89 | Ht 63.0 in | Wt 205.0 lb

## 2022-11-10 DIAGNOSIS — E782 Mixed hyperlipidemia: Secondary | ICD-10-CM

## 2022-11-10 DIAGNOSIS — M79604 Pain in right leg: Secondary | ICD-10-CM | POA: Diagnosis not present

## 2022-11-10 DIAGNOSIS — E538 Deficiency of other specified B group vitamins: Secondary | ICD-10-CM

## 2022-11-10 DIAGNOSIS — E559 Vitamin D deficiency, unspecified: Secondary | ICD-10-CM

## 2022-11-10 DIAGNOSIS — R5383 Other fatigue: Secondary | ICD-10-CM

## 2022-11-10 DIAGNOSIS — M545 Low back pain, unspecified: Secondary | ICD-10-CM

## 2022-11-10 DIAGNOSIS — I10 Essential (primary) hypertension: Secondary | ICD-10-CM | POA: Diagnosis not present

## 2022-11-10 DIAGNOSIS — R8290 Unspecified abnormal findings in urine: Secondary | ICD-10-CM

## 2022-11-10 DIAGNOSIS — E1165 Type 2 diabetes mellitus with hyperglycemia: Secondary | ICD-10-CM | POA: Diagnosis not present

## 2022-11-10 LAB — POCT URINALYSIS DIPSTICK
Bilirubin, UA: NEGATIVE
Blood, UA: NEGATIVE
Glucose, UA: POSITIVE — AB
Leukocytes, UA: NEGATIVE
Nitrite, UA: NEGATIVE
Protein, UA: POSITIVE — AB
Spec Grav, UA: 1.02 (ref 1.010–1.025)
Urobilinogen, UA: 1 E.U./dL
pH, UA: 5.5 (ref 5.0–8.0)

## 2022-11-10 LAB — POC CREATINE & ALBUMIN,URINE
Creatinine, POC: 300 mg/dL
Microalbumin Ur, POC: 80 mg/L

## 2022-11-10 MED ORDER — OZEMPIC (2 MG/DOSE) 8 MG/3ML ~~LOC~~ SOPN: 2.0000 mg | PEN_INJECTOR | SUBCUTANEOUS | 4 refills | Status: DC

## 2022-11-11 LAB — URINALYSIS, ROUTINE W REFLEX MICROSCOPIC
Bilirubin, UA: NEGATIVE
Leukocytes,UA: NEGATIVE
Nitrite, UA: NEGATIVE
RBC, UA: NEGATIVE
Specific Gravity, UA: >=1.03 — AB (ref 1.005–1.030)
Urobilinogen, Ur: 0.2 mg/dL (ref 0.2–1.0)
pH, UA: 6 (ref 5.0–7.5)

## 2022-11-11 LAB — COMPREHENSIVE METABOLIC PANEL
ALT: 16 IU/L (ref 0–32)
AST: 15 IU/L (ref 0–40)
Albumin/Globulin Ratio: 1.8 (ref 1.2–2.2)
Albumin: 4.9 g/dL (ref 3.8–4.9)
Alkaline Phosphatase: 55 IU/L (ref 44–121)
BUN/Creatinine Ratio: 16 (ref 9–23)
BUN: 18 mg/dL (ref 6–24)
Bilirubin Total: 0.4 mg/dL (ref 0.0–1.2)
CO2: 24 mmol/L (ref 20–29)
Calcium: 10 mg/dL (ref 8.7–10.2)
Chloride: 100 mmol/L (ref 96–106)
Creatinine, Ser: 1.14 mg/dL — ABNORMAL HIGH (ref 0.57–1.00)
Globulin, Total: 2.7 g/dL (ref 1.5–4.5)
Glucose: 91 mg/dL (ref 70–99)
Potassium: 4.2 mmol/L (ref 3.5–5.2)
Sodium: 141 mmol/L (ref 134–144)
Total Protein: 7.6 g/dL (ref 6.0–8.5)
eGFR: 56 mL/min/{1.73_m2} — ABNORMAL LOW (ref >59)

## 2022-11-11 LAB — CBC WITH DIFFERENTIAL/PLATELET
Basophils Absolute: 0.1 10*3/uL (ref 0.0–0.2)
Basos: 1 %
EOS (ABSOLUTE): 0.1 10*3/uL (ref 0.0–0.4)
Eos: 1 %
Hematocrit: 39.3 % (ref 34.0–46.6)
Hemoglobin: 13.3 g/dL (ref 11.1–15.9)
Immature Grans (Abs): 0 10*3/uL (ref 0.0–0.1)
Immature Granulocytes: 0 %
Lymphocytes Absolute: 2.6 10*3/uL (ref 0.7–3.1)
Lymphs: 40 %
MCH: 29.8 pg (ref 26.6–33.0)
MCHC: 33.8 g/dL (ref 31.5–35.7)
MCV: 88 fL (ref 79–97)
Monocytes Absolute: 0.3 10*3/uL (ref 0.1–0.9)
Monocytes: 5 %
Neutrophils Absolute: 3.4 10*3/uL (ref 1.4–7.0)
Neutrophils: 53 %
Platelets: 261 10*3/uL (ref 150–450)
RBC: 4.46 x10E6/uL (ref 3.77–5.28)
RDW: 14.1 % (ref 11.7–15.4)
WBC: 6.4 10*3/uL (ref 3.4–10.8)

## 2022-11-11 LAB — LIPID PANEL W/O CHOL/HDL RATIO
Cholesterol, Total: 136 mg/dL (ref 100–199)
HDL: 56 mg/dL (ref >39)
LDL Chol Calc (NIH): 65 mg/dL (ref 0–99)
Triglycerides: 75 mg/dL (ref 0–149)
VLDL Cholesterol Cal: 15 mg/dL (ref 5–40)

## 2022-11-11 LAB — VITAMIN D 25 HYDROXY (VIT D DEFICIENCY, FRACTURES): Vit D, 25-Hydroxy: 36.4 ng/mL (ref 30.0–100.0)

## 2022-11-11 LAB — HGB A1C W/O EAG: Hgb A1c MFr Bld: 6 % — ABNORMAL HIGH (ref 4.8–5.6)

## 2022-11-11 LAB — VITAMIN B12: Vitamin B-12: 483 pg/mL (ref 232–1245)

## 2022-11-11 LAB — TSH: TSH: 1.95 u[IU]/mL (ref 0.450–4.500)

## 2022-11-12 ENCOUNTER — Encounter: Payer: Self-pay | Admitting: Family

## 2022-11-12 NOTE — Progress Notes (Signed)
Established Patient Office Visit  Subjective:  Patient ID: Kimberly Benson, female    DOB: 08-Dec-1965  Age: 57 y.o. MRN: [id number]  Chief Complaint  Patient presents with   Follow-up    Sharp pain in lower right side    Patient is here today for follow up and with concerns about sharp right-sided pain.  She says that over the last week, she had a sharp pain in her right flank that started in the back and radiated to the front.  She has a history of kidney stones, and says that this pain is also similar.   She also needs refills.   Due for labs.    No other concerns at this time.   Past Medical History:  Diagnosis Date   Hypertension     Past Surgical History:  Procedure Laterality Date   BREAST EXCISIONAL BIOPSY Left 57 yrs old   benign   COLONOSCOPY WITH PROPOFOL N/A 11/25/2021   Procedure: COLONOSCOPY WITH PROPOFOL;  Surgeon: Toney Reil, MD;  Location: [facility] ENDOSCOPY;  Service: Gastroenterology;  Laterality: N/A;   TOTAL ABDOMINAL HYSTERECTOMY  2015   supracervical    Social History   Socioeconomic History   Marital status: Single    Spouse name: Not on file   Number of children: Not on file   Years of education: Not on file   Highest education level: Not on file  Occupational History   Not on file  Tobacco Use   Smoking status: Never   Smokeless tobacco: Never  Vaping Use   Vaping Use: Never used  Substance and Sexual Activity   Alcohol use: Yes    Comment: Social    Drug use: Never   Sexual activity: Yes    Birth control/protection: None  Other Topics Concern   Not on file  Social History Narrative   Not on file   Social Determinants of Health   Financial Resource Strain: Not on file  Food Insecurity: Not on file  Transportation Needs: Not on file  Physical Activity: Not on file  Stress: Not on file  Social Connections: Not on file  Intimate Partner Violence: Not on file    Family History  Problem Relation Age of Onset    Breast cancer Neg Hx     No Known Allergies  Review of Systems  Genitourinary:  Positive for flank pain.  All other systems reviewed and are negative.      Objective:   BP 124/78   Pulse 89   Ht 5\' 3"  (1.6 m)   Wt 205 lb (93 kg)   SpO2 96%   BMI 36.31 kg/m   Vitals:   11/10/22 1023  BP: 124/78  Pulse: 89  Height: 5\' 3"  (1.6 m)  Weight: 205 lb (93 kg)  SpO2: 96%  BMI (Calculated): 36.32    Physical Exam Vitals and nursing note reviewed.  Constitutional:      Appearance: Normal appearance. She is normal weight.  HENT:     Head: Normocephalic.  Eyes:     Pupils: Pupils are equal, round, and reactive to light.  Cardiovascular:     Rate and Rhythm: Normal rate.  Pulmonary:     Effort: Pulmonary effort is normal.  Abdominal:     Tenderness: There is right CVA tenderness.  Neurological:     Mental Status: She is alert.      Results for orders placed or performed in visit on 11/10/22  Urinalysis, Routine w reflex microscopic  Result Value Ref Range   Specific Gravity, UA      >=1.030 (A) 1.005 - 1.030   pH, UA 6.0 5.0 - 7.5   Color, UA Yellow Yellow   Appearance Ur Cloudy (A) Clear   Leukocytes,UA Negative Negative   Protein,UA Trace Negative/Trace   Glucose, UA 3+ (A) Negative   Ketones, UA Trace (A) Negative   RBC, UA Negative Negative   Bilirubin, UA Negative Negative   Urobilinogen, Ur 0.2 0.2 - 1.0 mg/dL   Nitrite, UA Negative Negative   Microscopic Examination Comment   POCT Urinalysis Dipstick (16109)  Result Value Ref Range   Color, UA     Clarity, UA     Glucose, UA Positive (A) Negative   Bilirubin, UA Negative    Ketones, UA Trace    Spec Grav, UA 1.020 1.010 - 1.025   Blood, UA Negative    pH, UA 5.5 5.0 - 8.0   Protein, UA Positive (A) Negative   Urobilinogen, UA 1.0 0.2 or 1.0 E.U./dL   Nitrite, UA Negative    Leukocytes, UA Negative Negative   Appearance     Odor    POC CREATINE & ALBUMIN,URINE  Result Value Ref Range    Microalbumin Ur, POC 80 mg/L   Creatinine, POC 300 mg/dL   Albumin/Creatinine Ratio, Urine, POC 30-300     Recent Results (from the past 2160 hour(s))  POCT Urinalysis Dipstick (60454)     Status: Abnormal   Collection Time: 11/10/22 10:35 AM  Result Value Ref Range   Color, UA     Clarity, UA     Glucose, UA Positive (A) Negative   Bilirubin, UA Negative    Ketones, UA Trace    Spec Grav, UA 1.020 1.010 - 1.025   Blood, UA Negative    pH, UA 5.5 5.0 - 8.0   Protein, UA Positive (A) Negative    Comment: 30mg /dl   Urobilinogen, UA 1.0 0.2 or 1.0 E.U./dL   Nitrite, UA Negative    Leukocytes, UA Negative Negative   Appearance     Odor    POC CREATINE & ALBUMIN,URINE     Status: Abnormal   Collection Time: 11/10/22 10:36 AM  Result Value Ref Range   Microalbumin Ur, POC 80 mg/L   Creatinine, POC 300 mg/dL   Albumin/Creatinine Ratio, Urine, POC 30-300   Urinalysis, Routine w reflex microscopic     Status: Abnormal   Collection Time: 11/10/22 11:47 AM  Result Value Ref Range   Specific Gravity, UA      >=1.030 (A) 1.005 - 1.030   pH, UA 6.0 5.0 - 7.5   Color, UA Yellow Yellow   Appearance Ur Cloudy (A) Clear   Leukocytes,UA Negative Negative   Protein,UA Trace Negative/Trace   Glucose, UA 3+ (A) Negative   Ketones, UA Trace (A) Negative   RBC, UA Negative Negative   Bilirubin, UA Negative Negative   Urobilinogen, Ur 0.2 0.2 - 1.0 mg/dL   Nitrite, UA Negative Negative   Microscopic Examination Comment     Comment: Microscopic not indicated and not performed.       Assessment & Plan:   Problem List Items Addressed This Visit       Endocrine   Type 2 diabetes mellitus with hyperglycemia (HCC)   Relevant Medications   FARXIGA 10 MG TABS tablet   olmesartan-hydrochlorothiazide (BENICAR HCT) 40-25 MG tablet   Semaglutide, 2 MG/DOSE, (OZEMPIC, 2 MG/DOSE,) 8 MG/3ML SOPN   Other Relevant Orders  POC CREATINE & ALBUMIN,URINE (Completed)   CBC With Differential    CMP14+EGFR   Hemoglobin A1c     Other   Hyperlipidemia   Relevant Medications   cloNIDine (CATAPRES) 0.1 MG tablet   olmesartan-hydrochlorothiazide (BENICAR HCT) 40-25 MG tablet   Other Relevant Orders   Lipid panel   CBC With Differential   CMP14+EGFR   Other Visit Diagnoses     Low back pain radiating to right lower extremity    -  Primary   Relevant Orders   POCT Urinalysis Dipstick (81002) (Completed)   CBC With Differential   CMP14+EGFR   Urinalysis, Routine w reflex microscopic (Completed)   Urine Culture   B12 deficiency due to diet       Relevant Orders   CBC With Differential   CMP14+EGFR   Vitamin B12   Essential hypertension, benign       Relevant Medications   cloNIDine (CATAPRES) 0.1 MG tablet   olmesartan-hydrochlorothiazide (BENICAR HCT) 40-25 MG tablet   Other Relevant Orders   CBC With Differential   CMP14+EGFR   Vitamin D deficiency, unspecified       Relevant Orders   VITAMIN D 25 Hydroxy (Vit-D Deficiency, Fractures)   CBC With Differential   CMP14+EGFR   Other fatigue       Relevant Orders   CBC With Differential   CMP14+EGFR   TSH   Abnormal urine findings       Relevant Orders   Urinalysis, Routine w reflex microscopic (Completed)   Urine Culture       Return in about 6 months (around 05/13/2023) for F/U.   Total time spent: 30 minutes  Miki Kins, FNP  11/10/2022

## 2022-11-14 ENCOUNTER — Encounter: Payer: Self-pay | Admitting: Family

## 2022-11-14 LAB — URINE CULTURE

## 2022-11-14 MED ORDER — CIPROFLOXACIN HCL 500 MG PO TABS: 500.0000 mg | ORAL_TABLET | Freq: Two times a day (BID) | ORAL | 0 refills | Status: AC

## 2022-11-14 NOTE — Addendum Note (Signed)
Addended by: Grayling Congress on: 11/14/2022 01:20 PM   Modules accepted: Orders

## 2022-11-22 IMAGING — MG MM DIGITAL SCREENING BILAT W/ TOMO AND CAD
8 series · 8 of 24 positions shown · non-contrast
Comparison: Previous exam(s).

CLINICAL DATA: Screening.

EXAM:
DIGITAL SCREENING BILATERAL MAMMOGRAM WITH TOMOSYNTHESIS AND CAD
TECHNIQUE: Bilateral screening digital craniocaudal and mediolateral oblique
mammograms were obtained. Bilateral screening digital breast
tomosynthesis was performed. The images were evaluated with
computer-aided detection.

[L CC synth-2D]
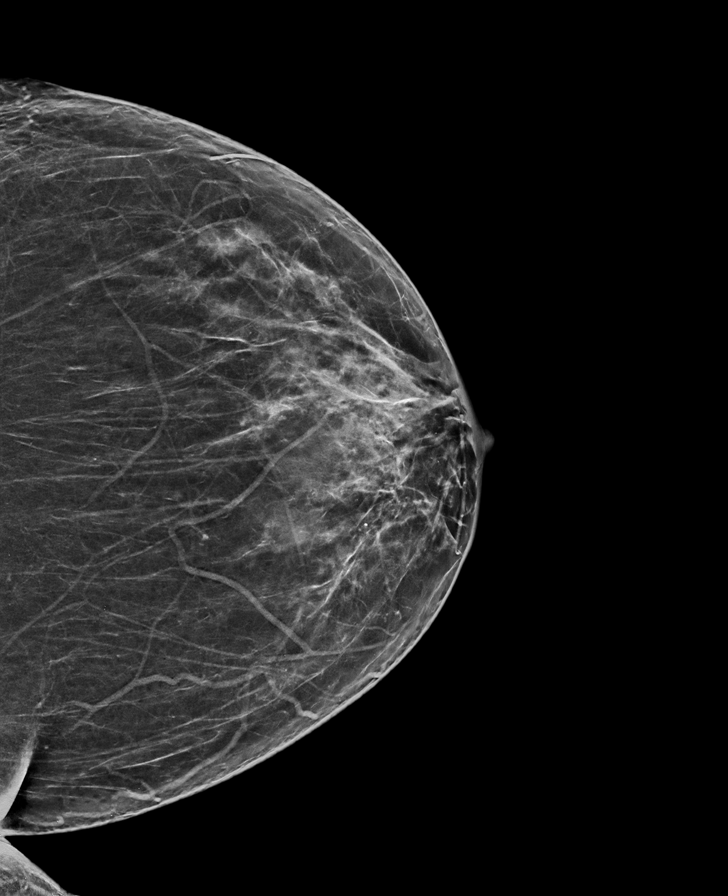

[L MLO synth-2D]
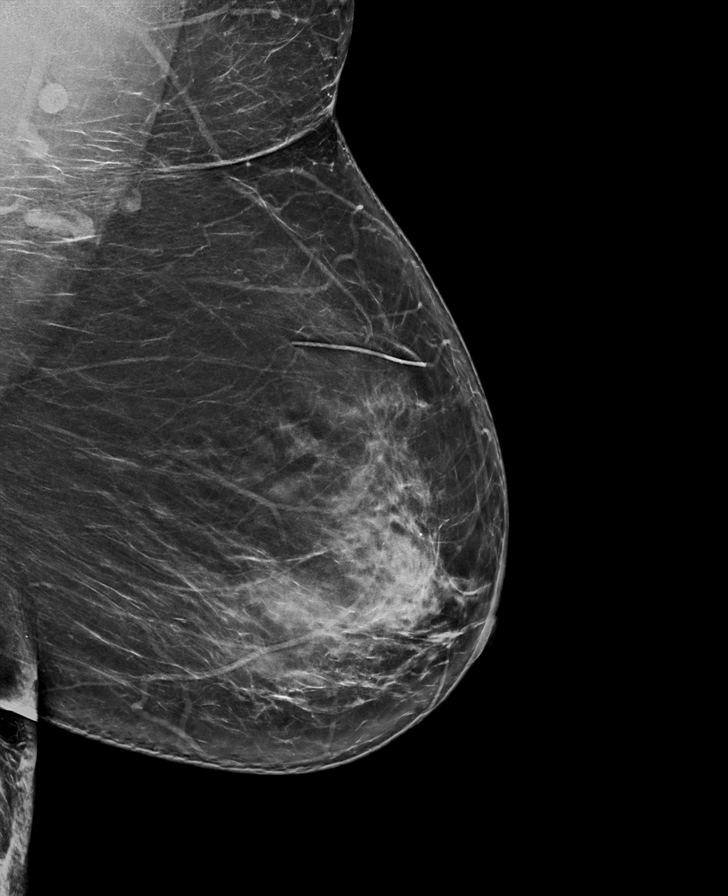

[R MLO synth-2D]
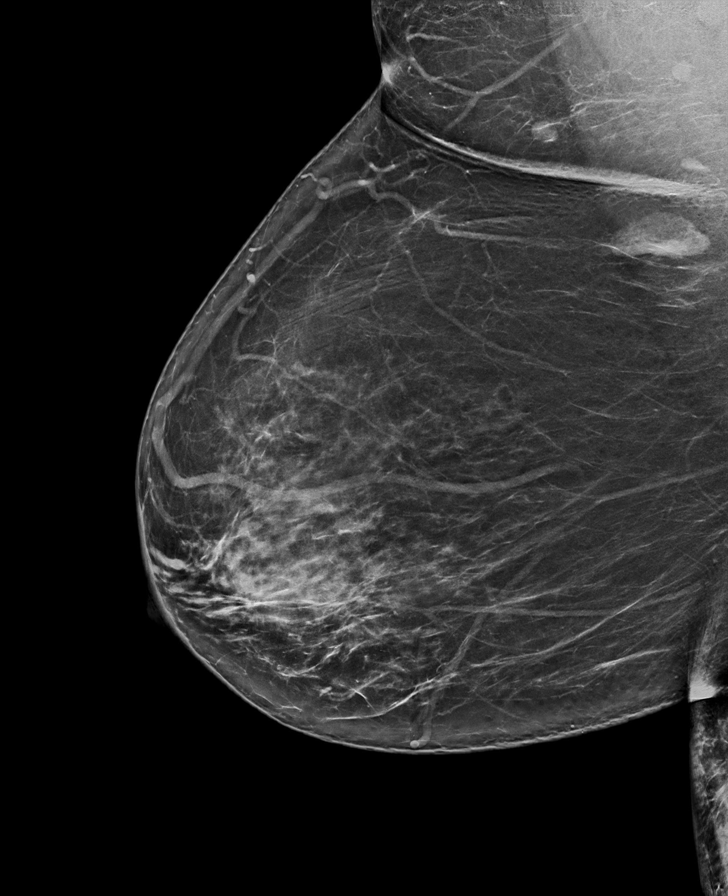

[R CC synth-2D]
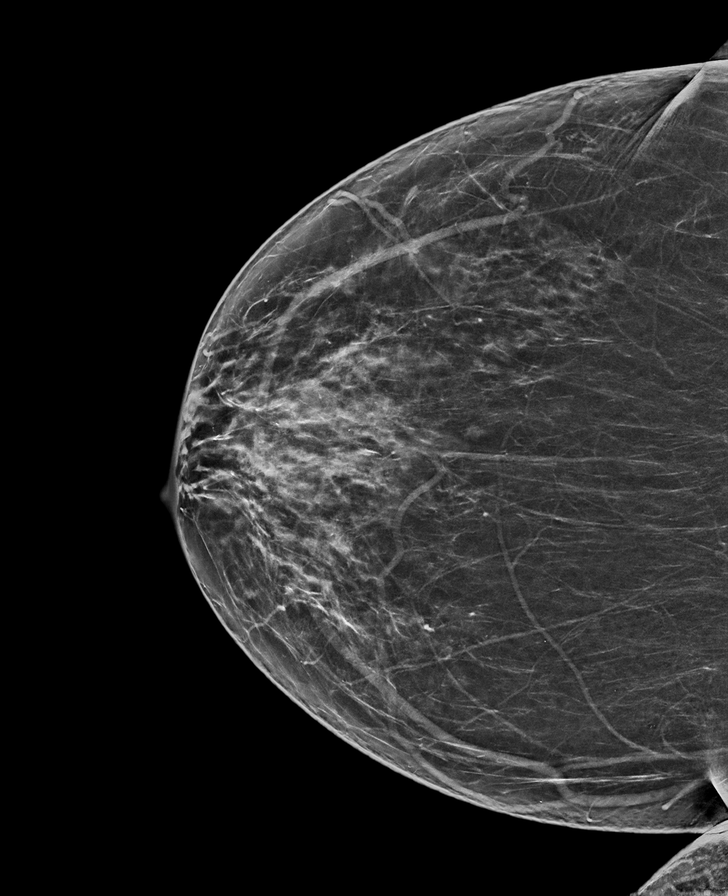

[L MLO tomo · tomo slice 45/90.0]
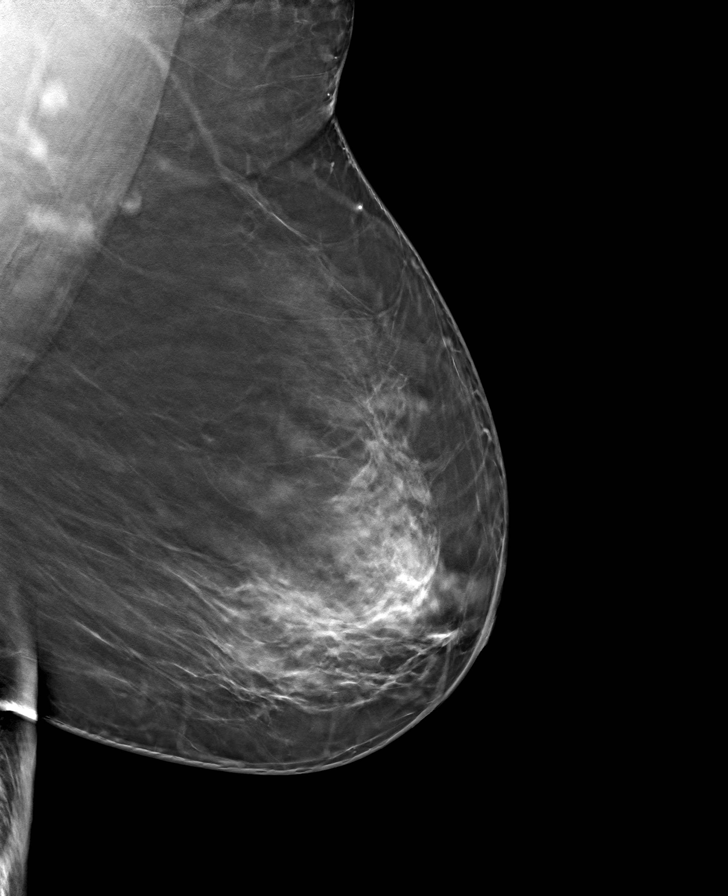

[R MLO tomo · tomo slice 46/91.0]
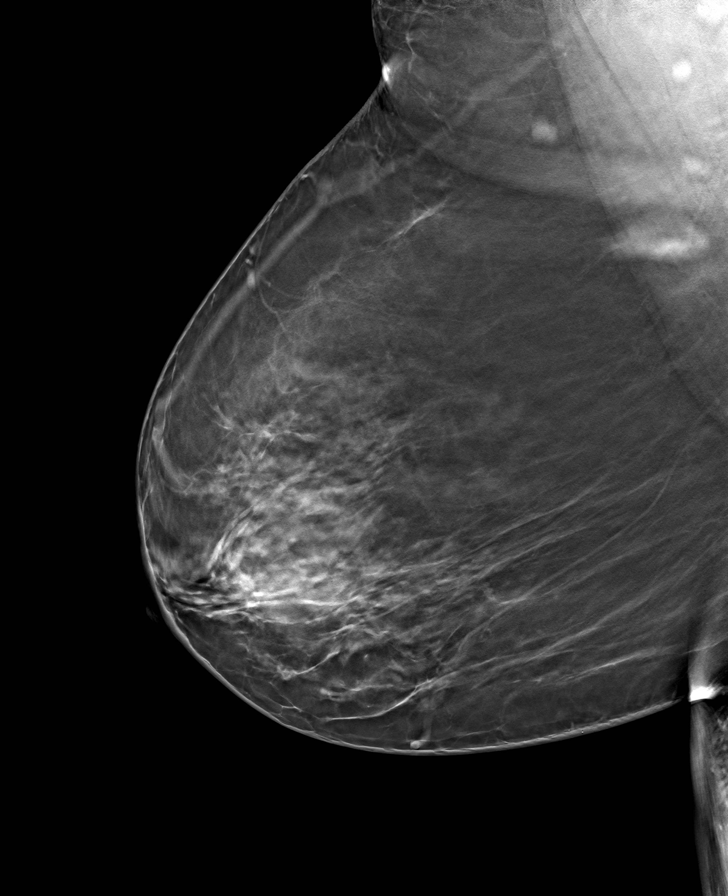

[L CC tomo · tomo slice 39/76.0]
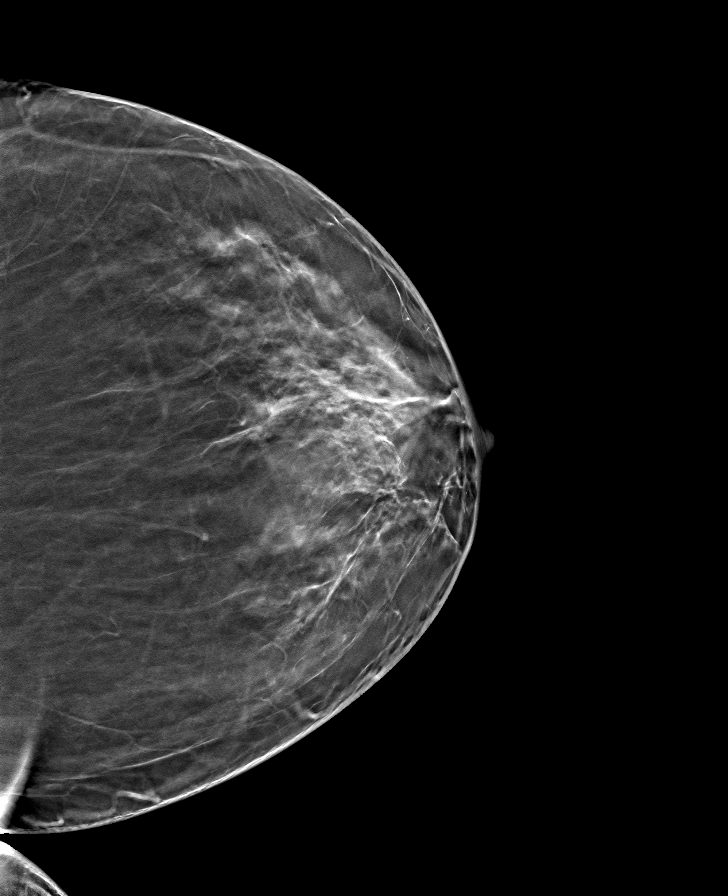

[R CC tomo · tomo slice 39/78.0]
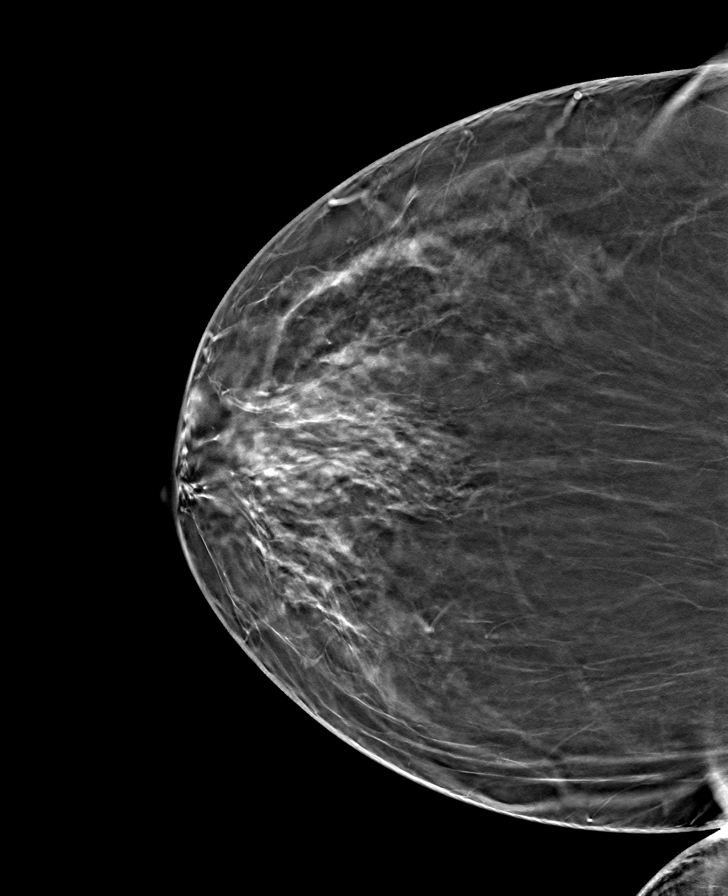

[8 of 24 positions shown; findings below may reference images not displayed]

ACR Breast Density Category c: The breast tissue is heterogeneously
dense, which may obscure small masses.
FINDINGS: There are no findings suspicious for malignancy.
IMPRESSION: No mammographic evidence of malignancy. A result letter of this
screening mammogram will be mailed directly to the patient.

RECOMMENDATION:
Screening mammogram in one year. (Code:Q3-W-BC3)

BI-RADS CATEGORY  1: Negative.

## 2022-12-22 ENCOUNTER — Other Ambulatory Visit: Payer: Self-pay | Admitting: Family

## 2022-12-22 DIAGNOSIS — I10 Essential (primary) hypertension: Secondary | ICD-10-CM

## 2022-12-23 ENCOUNTER — Other Ambulatory Visit: Payer: Self-pay | Admitting: Family

## 2022-12-23 DIAGNOSIS — I10 Essential (primary) hypertension: Secondary | ICD-10-CM

## 2023-01-04 ENCOUNTER — Other Ambulatory Visit: Payer: Self-pay | Admitting: Family

## 2023-01-04 DIAGNOSIS — E1165 Type 2 diabetes mellitus with hyperglycemia: Secondary | ICD-10-CM

## 2023-01-24 ENCOUNTER — Other Ambulatory Visit: Payer: Self-pay | Admitting: Internal Medicine

## 2023-01-24 ENCOUNTER — Encounter: Payer: Self-pay | Admitting: Internal Medicine

## 2023-01-24 DIAGNOSIS — Z1231 Encounter for screening mammogram for malignant neoplasm of breast: Secondary | ICD-10-CM

## 2023-02-16 ENCOUNTER — Ambulatory Visit
Admission: RE | Admit: 2023-02-16 | Discharge: 2023-02-16 | Disposition: A | Discharge status: Home / Self Care | Payer: Federal, State, Local not specified - PPO | Source: Ambulatory Visit | Attending: Internal Medicine | Admitting: Internal Medicine

## 2023-02-16 DIAGNOSIS — Z1231 Encounter for screening mammogram for malignant neoplasm of breast: Secondary | ICD-10-CM | POA: Diagnosis not present

## 2023-04-19 ENCOUNTER — Other Ambulatory Visit: Payer: Self-pay | Admitting: Family

## 2023-04-19 DIAGNOSIS — I10 Essential (primary) hypertension: Secondary | ICD-10-CM

## 2023-05-18 ENCOUNTER — Encounter: Payer: Self-pay | Admitting: Family

## 2023-05-18 ENCOUNTER — Ambulatory Visit: Payer: Federal, State, Local not specified - PPO | Admitting: Family

## 2023-05-18 VITALS — BP 130/90 | HR 87 | Ht 63.0 in | Wt 201.4 lb

## 2023-05-18 DIAGNOSIS — E1165 Type 2 diabetes mellitus with hyperglycemia: Secondary | ICD-10-CM | POA: Diagnosis not present

## 2023-05-18 DIAGNOSIS — I10 Essential (primary) hypertension: Secondary | ICD-10-CM | POA: Diagnosis not present

## 2023-05-18 DIAGNOSIS — E559 Vitamin D deficiency, unspecified: Secondary | ICD-10-CM

## 2023-05-18 DIAGNOSIS — Z114 Encounter for screening for human immunodeficiency virus [HIV]: Secondary | ICD-10-CM

## 2023-05-18 DIAGNOSIS — E538 Deficiency of other specified B group vitamins: Secondary | ICD-10-CM

## 2023-05-18 DIAGNOSIS — E782 Mixed hyperlipidemia: Secondary | ICD-10-CM | POA: Diagnosis not present

## 2023-05-18 DIAGNOSIS — Z1159 Encounter for screening for other viral diseases: Secondary | ICD-10-CM

## 2023-05-18 DIAGNOSIS — R5383 Other fatigue: Secondary | ICD-10-CM

## 2023-05-18 MED ORDER — MOUNJARO 5 MG/0.5ML ~~LOC~~ SOAJ: 5.0000 mg | SUBCUTANEOUS | 0 refills | Status: DC

## 2023-05-18 MED ORDER — MOUNJARO 5 MG/0.5ML ~~LOC~~ SOAJ: 5.0000 mg | SUBCUTANEOUS | 3 refills | Status: DC

## 2023-05-18 MED ORDER — DAPAGLIFLOZIN PROPANEDIOL 10 MG PO TABS: 10.0000 mg | ORAL_TABLET | Freq: Every day | ORAL | 1 refills | Status: DC

## 2023-05-18 NOTE — Assessment & Plan Note (Addendum)
Checking labs today. Will call pt. With results  Changing to [facility] from Ozempic, because patient does not feel that Ozempic is working as well as it previously was. Continue other diabetes POC, as patient has been well controlled on current regimen.  Will adjust meds if needed based on labs.

## 2023-05-18 NOTE — Progress Notes (Signed)
Established Patient Office Visit  Subjective:  Patient ID: Kimberly Benson, female    DOB: January 08, 1966  Age: 57 y.o. MRN: [id number]  Chief Complaint  Patient presents with   Follow-up    6 month follow up    Patient is here today for her 3 months follow up.  She has been feeling well since last appointment.   She does have additional concerns to discuss today.   Labs are due today. She needs refills.   I have reviewed her active problem list, medication list, allergies, notes from last encounter, lab results for her appointment today.      No other concerns at this time.   Past Medical History:  Diagnosis Date   Hypertension     Past Surgical History:  Procedure Laterality Date   BREAST EXCISIONAL BIOPSY Left 57 yrs old   benign   COLONOSCOPY WITH PROPOFOL N/A 11/25/2021   Procedure: COLONOSCOPY WITH PROPOFOL;  Surgeon: Toney Reil, MD;  Location: [facility] ENDOSCOPY;  Service: Gastroenterology;  Laterality: N/A;   TOTAL ABDOMINAL HYSTERECTOMY  2015   supracervical    Social History   Socioeconomic History   Marital status: Single    Spouse name: Not on file   Number of children: Not on file   Years of education: Not on file   Highest education level: Not on file  Occupational History   Not on file  Tobacco Use   Smoking status: Never   Smokeless tobacco: Never  Vaping Use   Vaping status: Never Used  Substance and Sexual Activity   Alcohol use: Yes    Comment: Social    Drug use: Never   Sexual activity: Yes    Birth control/protection: None  Other Topics Concern   Not on file  Social History Narrative   Not on file   Social Determinants of Health   Financial Resource Strain: Not on file  Food Insecurity: Not on file  Transportation Needs: Not on file  Physical Activity: Not on file  Stress: Not on file  Social Connections: Not on file  Intimate Partner Violence: Not on file    Family History  Problem Relation Age of Onset   Breast  cancer Neg Hx     No Known Allergies  Review of Systems  All other systems reviewed and are negative.      Objective:   BP (!) 130/90   Pulse 87   Ht 5\' 3"  (1.6 m)   Wt 201 lb 6.4 oz (91.4 kg)   SpO2 95%   BMI 35.68 kg/m   Vitals:   05/18/23 0904  BP: (!) 130/90  Pulse: 87  Height: 5\' 3"  (1.6 m)  Weight: 201 lb 6.4 oz (91.4 kg)  SpO2: 95%  BMI (Calculated): 35.69    Physical Exam Vitals and nursing note reviewed.  Constitutional:      Appearance: Normal appearance. She is normal weight.  HENT:     Head: Normocephalic.  Eyes:     Extraocular Movements: Extraocular movements intact.     Conjunctiva/sclera: Conjunctivae normal.     Pupils: Pupils are equal, round, and reactive to light.  Cardiovascular:     Rate and Rhythm: Normal rate and regular rhythm.  Pulmonary:     Effort: Pulmonary effort is normal.  Neurological:     General: No focal deficit present.     Mental Status: She is alert and oriented to person, place, and time. Mental status is at baseline.  Psychiatric:  Mood and Affect: Mood normal.        Behavior: Behavior normal.        Thought Content: Thought content normal.        Judgment: Judgment normal.      No results found for any visits on 05/18/23.  No results found for this or any previous visit (from the past 2160 hour(s)).     Assessment & Plan:   Problem List Items Addressed This Visit       Active Problems   Type 2 diabetes mellitus with hyperglycemia (HCC)    Checking labs today. Will call pt. With results  Changing to [facility] from Ozempic, because patient does not feel that Ozempic is working as well as it previously was. Continue other diabetes POC, as patient has been well controlled on current regimen.  Will adjust meds if needed based on labs.       Relevant Medications   dapagliflozin propanediol (FARXIGA) 10 MG TABS tablet   tirzepatide (MOUNJARO) 5 MG/0.5ML Pen   Other Relevant Orders   CMP14+EGFR    Hemoglobin A1c   CBC with Diff   Hyperlipidemia - Primary   Relevant Orders   Lipid panel   CMP14+EGFR   CBC with Diff   Essential hypertension    Blood pressure well controlled with current medications.  Continue current therapy.  Will reassess at follow up.        Other Visit Diagnoses     Vitamin D deficiency, unspecified       Checking labs today.  Will continue supplements as needed.   Relevant Orders   VITAMIN D 25 Hydroxy (Vit-D Deficiency, Fractures)   CMP14+EGFR   Vitamin B12   CBC with Diff   B12 deficiency due to diet       Checking labs today.  Will continue supplements as needed.   Relevant Orders   CMP14+EGFR   CBC with Diff   Other fatigue       Checking labs today.  Will continue supplements as needed.   Relevant Orders   CMP14+EGFR   TSH   CBC with Diff   Need for hepatitis C screening test       Test ordered in office today. Will call with results.   Relevant Orders   Hepatitis C Ab reflex to Quant PCR   Screening for HIV without presence of risk factors       Test ordered in office today. Will call with results.   Relevant Orders   HIV antibody (with reflex)       Return in about 3 months (around 08/18/2023).   Total time spent: 20 minutes  Miki Kins, FNP  05/18/2023   This document may have been prepared by [facility] Voice Recognition software and as such may include unintentional dictation errors.

## 2023-05-18 NOTE — Assessment & Plan Note (Signed)
Blood pressure well controlled with current medications.  Continue current therapy.  Will reassess at follow up.  

## 2023-05-19 LAB — HCV INTERPRETATION

## 2023-05-19 LAB — TSH: TSH: 2.24 u[IU]/mL (ref 0.450–4.500)

## 2023-05-19 LAB — CMP14+EGFR
ALT: 17 [IU]/L (ref 0–32)
AST: 19 [IU]/L (ref 0–40)
Albumin: 4.8 g/dL (ref 3.8–4.9)
Alkaline Phosphatase: 57 [IU]/L (ref 44–121)
BUN/Creatinine Ratio: 12 (ref 9–23)
BUN: 13 mg/dL (ref 6–24)
Bilirubin Total: 0.4 mg/dL (ref 0.0–1.2)
CO2: 23 mmol/L (ref 20–29)
Calcium: 9.9 mg/dL (ref 8.7–10.2)
Chloride: 102 mmol/L (ref 96–106)
Creatinine, Ser: 1.07 mg/dL — ABNORMAL HIGH (ref 0.57–1.00)
Globulin, Total: 2.7 g/dL (ref 1.5–4.5)
Glucose: 84 mg/dL (ref 70–99)
Potassium: 3.9 mmol/L (ref 3.5–5.2)
Sodium: 143 mmol/L (ref 134–144)
Total Protein: 7.5 g/dL (ref 6.0–8.5)
eGFR: 61 mL/min/{1.73_m2} (ref >59)

## 2023-05-19 LAB — HIV ANTIBODY (ROUTINE TESTING W REFLEX): HIV Screen 4th Generation wRfx: NONREACTIVE

## 2023-05-19 LAB — CBC WITH DIFFERENTIAL/PLATELET
Basophils Absolute: 0 10*3/uL (ref 0.0–0.2)
Basos: 1 %
EOS (ABSOLUTE): 0.2 10*3/uL (ref 0.0–0.4)
Eos: 3 %
Hematocrit: 42.7 % (ref 34.0–46.6)
Hemoglobin: 13.8 g/dL (ref 11.1–15.9)
Immature Grans (Abs): 0 10*3/uL (ref 0.0–0.1)
Immature Granulocytes: 0 %
Lymphocytes Absolute: 2.1 10*3/uL (ref 0.7–3.1)
Lymphs: 32 %
MCH: 29.6 pg (ref 26.6–33.0)
MCHC: 32.3 g/dL (ref 31.5–35.7)
MCV: 92 fL (ref 79–97)
Monocytes Absolute: 0.4 10*3/uL (ref 0.1–0.9)
Monocytes: 5 %
Neutrophils Absolute: 3.9 10*3/uL (ref 1.4–7.0)
Neutrophils: 59 %
Platelets: 264 10*3/uL (ref 150–450)
RBC: 4.66 x10E6/uL (ref 3.77–5.28)
RDW: 15.4 % (ref 11.7–15.4)
WBC: 6.6 10*3/uL (ref 3.4–10.8)

## 2023-05-19 LAB — VITAMIN D 25 HYDROXY (VIT D DEFICIENCY, FRACTURES): Vit D, 25-Hydroxy: 33 ng/mL (ref 30.0–100.0)

## 2023-05-19 LAB — HEMOGLOBIN A1C
Est. average glucose Bld gHb Est-mCnc: 117 mg/dL
Hgb A1c MFr Bld: 5.7 % — ABNORMAL HIGH (ref 4.8–5.6)

## 2023-05-19 LAB — LIPID PANEL
Chol/HDL Ratio: 2.7 ratio (ref 0.0–4.4)
Cholesterol, Total: 189 mg/dL (ref 100–199)
HDL: 69 mg/dL (ref >39)
LDL Chol Calc (NIH): 102 mg/dL — ABNORMAL HIGH (ref 0–99)
Triglycerides: 102 mg/dL (ref 0–149)
VLDL Cholesterol Cal: 18 mg/dL (ref 5–40)

## 2023-05-19 LAB — HCV AB W REFLEX TO QUANT PCR: HCV Ab: NONREACTIVE

## 2023-05-19 LAB — VITAMIN B12: Vitamin B-12: 345 pg/mL (ref 232–1245)

## 2023-05-28 ENCOUNTER — Other Ambulatory Visit: Payer: Self-pay | Admitting: Family

## 2023-05-29 ENCOUNTER — Ambulatory Visit (INDEPENDENT_AMBULATORY_CARE_PROVIDER_SITE_OTHER): Payer: Federal, State, Local not specified - PPO | Admitting: Family

## 2023-05-29 DIAGNOSIS — R5383 Other fatigue: Secondary | ICD-10-CM

## 2023-05-29 MED ORDER — CYANOCOBALAMIN 1000 MCG/ML IJ SOLN
1000.0000 ug | Freq: Once | INTRAMUSCULAR | Status: AC
  Administered 2023-05-29: 1000 ug via INTRAMUSCULAR

## 2023-06-20 ENCOUNTER — Other Ambulatory Visit: Payer: Self-pay | Admitting: Family

## 2023-06-29 ENCOUNTER — Ambulatory Visit (INDEPENDENT_AMBULATORY_CARE_PROVIDER_SITE_OTHER): Payer: Federal, State, Local not specified - PPO | Admitting: Family

## 2023-06-29 DIAGNOSIS — E538 Deficiency of other specified B group vitamins: Secondary | ICD-10-CM | POA: Diagnosis not present

## 2023-06-29 MED ORDER — CYANOCOBALAMIN 1000 MCG/ML IJ SOLN
1000.0000 ug | Freq: Once | INTRAMUSCULAR | Status: AC
  Administered 2023-06-29: 1000 ug via INTRAMUSCULAR

## 2023-07-19 ENCOUNTER — Other Ambulatory Visit: Payer: Self-pay | Admitting: Family

## 2023-07-27 ENCOUNTER — Ambulatory Visit: Payer: Federal, State, Local not specified - PPO

## 2023-07-30 ENCOUNTER — Ambulatory Visit (INDEPENDENT_AMBULATORY_CARE_PROVIDER_SITE_OTHER): Payer: Federal, State, Local not specified - PPO | Admitting: Family

## 2023-07-30 DIAGNOSIS — E538 Deficiency of other specified B group vitamins: Secondary | ICD-10-CM

## 2023-07-30 MED ORDER — CYANOCOBALAMIN 1000 MCG/ML IJ SOLN
1000.0000 ug | Freq: Once | INTRAMUSCULAR | Status: AC
  Administered 2023-07-30: 1000 ug via INTRAMUSCULAR

## 2023-07-30 NOTE — Progress Notes (Signed)
   CHIEF COMPLAINT  B12 Shot     REASON FOR VISIT  B12 Injection     ASSESSMENT  B12 Deficiency, Unspecified     PLAN  Diagnoses and all orders for this visit:  B12 deficiency due to diet -     cyanocobalamin (VITAMIN B12) injection 1,000 mcg     Pt. given B12 injection in clinic.  Return for next injection per provider instructions.   Total time spent: 5 minutes  Miki Kins, FNP  07/30/2023

## 2023-08-03 ENCOUNTER — Telehealth: Payer: Self-pay | Admitting: Family

## 2023-08-03 NOTE — Telephone Encounter (Signed)
Patient left VM that her Kimberly Benson is on backorder and she would like to come by and pick up a sample. Spoke with patient and advised her that the samples are 2.5 mg and she is on 5 mg. Informed patient that she can call around to pharmacies and maybe find somewhere that has it in stock and we can send it there. Patient verbalized understanding.

## 2023-08-24 ENCOUNTER — Ambulatory Visit: Payer: Federal, State, Local not specified - PPO | Admitting: Family

## 2023-08-31 ENCOUNTER — Ambulatory Visit: Payer: Federal, State, Local not specified - PPO | Admitting: Family

## 2023-09-16 ENCOUNTER — Other Ambulatory Visit: Payer: Self-pay | Admitting: Family

## 2023-09-16 DIAGNOSIS — E782 Mixed hyperlipidemia: Secondary | ICD-10-CM

## 2023-10-19 ENCOUNTER — Other Ambulatory Visit: Payer: Self-pay | Admitting: Family

## 2023-11-02 ENCOUNTER — Ambulatory Visit (INDEPENDENT_AMBULATORY_CARE_PROVIDER_SITE_OTHER): Admitting: Obstetrics and Gynecology

## 2023-11-02 ENCOUNTER — Encounter: Payer: Self-pay | Admitting: Obstetrics and Gynecology

## 2023-11-02 ENCOUNTER — Other Ambulatory Visit (HOSPITAL_COMMUNITY)
Admission: RE | Admit: 2023-11-02 | Discharge: 2023-11-02 | Disposition: A | Discharge status: Home / Self Care | Source: Ambulatory Visit | Attending: Obstetrics and Gynecology | Admitting: Obstetrics and Gynecology

## 2023-11-02 VITALS — BP 80/44 | HR 74 | Ht 63.0 in | Wt 196.0 lb

## 2023-11-02 DIAGNOSIS — Z1151 Encounter for screening for human papillomavirus (HPV): Secondary | ICD-10-CM | POA: Diagnosis not present

## 2023-11-02 DIAGNOSIS — Z113 Encounter for screening for infections with a predominantly sexual mode of transmission: Secondary | ICD-10-CM | POA: Insufficient documentation

## 2023-11-02 DIAGNOSIS — Z1339 Encounter for screening examination for other mental health and behavioral disorders: Secondary | ICD-10-CM

## 2023-11-02 DIAGNOSIS — Z01419 Encounter for gynecological examination (general) (routine) without abnormal findings: Secondary | ICD-10-CM | POA: Insufficient documentation

## 2023-11-02 NOTE — Progress Notes (Signed)
 Subjective:     Kimberly Benson is a 58 y.o. female postmenopausal with BMI 34 who is here for a comprehensive physical exam. The patient reports no problems. She is sexually active without complaints. She denies pelvic pain or abnormal discharge. She denies urinary incontinence. She reports some occasional hot flushes which are currently manageable. Patient is without any complaints. She has CHTN with a low blood pressure reading today. Patient denies feeling dizzy or lightheaded.  Past Medical History:  Diagnosis Date   Hypertension    Past Surgical History:  Procedure Laterality Date   BREAST EXCISIONAL BIOPSY Left 58 yrs old   benign   COLONOSCOPY WITH PROPOFOL  N/A 11/25/2021   Procedure: COLONOSCOPY WITH PROPOFOL ;  Surgeon: Selena Daily, MD;  Location: [facility]COPY;  Service: Gastroenterology;  Laterality: N/A;   TOTAL ABDOMINAL HYSTERECTOMY  2015   supracervical   Family History  Problem Relation Age of Onset   Breast cancer Neg Hx     Social History   Socioeconomic History   Marital status: Single    Spouse name: Not on file   Number of children: Not on file   Years of education: Not on file   Highest education level: Not on file  Occupational History   Not on file  Tobacco Use   Smoking status: Never   Smokeless tobacco: Never  Vaping Use   Vaping status: Never Used  Substance and Sexual Activity   Alcohol use: Yes    Comment: Social    Drug use: Never   Sexual activity: Yes    Birth control/protection: None  Other Topics Concern   Not on file  Social History Narrative   Not on file   Social Drivers of Health   Financial Resource Strain: Not on file  Food Insecurity: Not on file  Transportation Needs: Not on file  Physical Activity: Not on file  Stress: Not on file  Social Connections: Not on file  Intimate Partner Violence: Not on file   Health Maintenance  Topic Date Due   FOOT EXAM  Never done   OPHTHALMOLOGY EXAM  Never done    DTaP/Tdap/Td (1 - Tdap) Never done   Pneumococcal Vaccine 79-1 Years old (1 of 2 - PCV) Never done   Zoster Vaccines- Shingrix  (2 of 2) 12/20/2020   COVID-19 Vaccine (3 - 2024-25 season) 03/11/2023   Diabetic kidney evaluation - Urine ACR  11/10/2023   HEMOGLOBIN A1C  11/15/2023   INFLUENZA VACCINE  02/08/2024   Diabetic kidney evaluation - eGFR measurement  05/17/2024   MAMMOGRAM  02/15/2025   Colonoscopy  11/26/2031   Hepatitis C Screening  Completed   HIV Screening  Completed   HPV VACCINES  Aged Out   Meningococcal B Vaccine  Aged Out       Review of Systems Pertinent items noted in HPI and remainder of comprehensive ROS otherwise negative.   Objective:  Blood pressure (!) 80/44, pulse 74, height 5\' 3"  (1.6 m), weight 196 lb (88.9 kg).   GENERAL: Well-developed, well-nourished female in no acute distress.  HEENT: Normocephalic, atraumatic. Sclerae anicteric.  NECK: Supple. Normal thyroid.  LUNGS: Clear to auscultation bilaterally.  HEART: Regular rate and rhythm. BREASTS: Symmetric in size. No palpable masses or lymphadenopathy, skin changes, or nipple drainage. ABDOMEN: Soft, nontender, nondistended. No organomegaly. PELVIC: Normal external female genitalia. Vagina is pink and rugated.  Normal discharge. Normal appearing cervix. No adnexal mass or tenderness. Chaperone present during the pelvic exam EXTREMITIES: No cyanosis, clubbing,  or edema, 2+ distal pulses.     Assessment:    Healthy female exam.      Plan:    Pap smear collected Screening mammogram ordered Patient will be contacted with abnormal results Patient is current on colonoscopy Patient with recent health maintenance labs Patient advised to follow up with PCP regarding low BP on medication. Patient has had a 20 lb weight loss since last year See After Visit Summary for Counseling Recommendations

## 2023-11-06 ENCOUNTER — Other Ambulatory Visit

## 2023-11-06 DIAGNOSIS — E1165 Type 2 diabetes mellitus with hyperglycemia: Secondary | ICD-10-CM

## 2023-11-06 DIAGNOSIS — E782 Mixed hyperlipidemia: Secondary | ICD-10-CM

## 2023-11-06 DIAGNOSIS — I10 Essential (primary) hypertension: Secondary | ICD-10-CM

## 2023-11-07 ENCOUNTER — Ambulatory Visit: Admitting: Family

## 2023-11-07 LAB — CMP14+EGFR
ALT: 9 IU/L (ref 0–32)
AST: 13 IU/L (ref 0–40)
Albumin: 4.8 g/dL (ref 3.8–4.9)
Alkaline Phosphatase: 57 IU/L (ref 44–121)
BUN/Creatinine Ratio: 25 — ABNORMAL HIGH (ref 9–23)
BUN: 26 mg/dL — ABNORMAL HIGH (ref 6–24)
Bilirubin Total: 0.4 mg/dL (ref 0.0–1.2)
CO2: 25 mmol/L (ref 20–29)
Calcium: 9.9 mg/dL (ref 8.7–10.2)
Chloride: 102 mmol/L (ref 96–106)
Creatinine, Ser: 1.06 mg/dL — ABNORMAL HIGH (ref 0.57–1.00)
Globulin, Total: 2.7 g/dL (ref 1.5–4.5)
Glucose: 93 mg/dL (ref 70–99)
Potassium: 3.9 mmol/L (ref 3.5–5.2)
Sodium: 142 mmol/L (ref 134–144)
Total Protein: 7.5 g/dL (ref 6.0–8.5)
eGFR: 61 mL/min/{1.73_m2} (ref >59)

## 2023-11-07 LAB — LIPID PANEL
Chol/HDL Ratio: 2.3 ratio (ref 0.0–4.4)
Cholesterol, Total: 137 mg/dL (ref 100–199)
HDL: 59 mg/dL (ref >39)
LDL Chol Calc (NIH): 63 mg/dL (ref 0–99)
Triglycerides: 79 mg/dL (ref 0–149)
VLDL Cholesterol Cal: 15 mg/dL (ref 5–40)

## 2023-11-07 LAB — HEMOGLOBIN A1C
Est. average glucose Bld gHb Est-mCnc: 123 mg/dL
Hgb A1c MFr Bld: 5.9 % — ABNORMAL HIGH (ref 4.8–5.6)

## 2023-11-07 LAB — CYTOLOGY - PAP
Adequacy: ABSENT
Chlamydia: NEGATIVE
Comment: NEGATIVE
Comment: NEGATIVE
Comment: NORMAL
Diagnosis: NEGATIVE
High risk HPV: NEGATIVE
Neisseria Gonorrhea: NEGATIVE

## 2023-11-07 LAB — TSH: TSH: 1.73 u[IU]/mL (ref 0.450–4.500)

## 2023-11-09 ENCOUNTER — Ambulatory Visit: Admitting: Family

## 2023-11-09 ENCOUNTER — Encounter: Payer: Self-pay | Admitting: Family

## 2023-11-09 VITALS — BP 88/66 | HR 92 | Ht 63.0 in | Wt 198.2 lb

## 2023-11-09 DIAGNOSIS — E66812 Obesity, class 2: Secondary | ICD-10-CM | POA: Diagnosis not present

## 2023-11-09 DIAGNOSIS — E782 Mixed hyperlipidemia: Secondary | ICD-10-CM

## 2023-11-09 DIAGNOSIS — E1165 Type 2 diabetes mellitus with hyperglycemia: Secondary | ICD-10-CM | POA: Diagnosis not present

## 2023-11-09 DIAGNOSIS — F411 Generalized anxiety disorder: Secondary | ICD-10-CM | POA: Diagnosis not present

## 2023-11-09 MED ORDER — ALPRAZOLAM 0.25 MG PO TABS: 0.2500 mg | ORAL_TABLET | Freq: Two times a day (BID) | ORAL | 3 refills | Status: DC | PRN

## 2023-12-12 ENCOUNTER — Other Ambulatory Visit: Payer: Self-pay | Admitting: Family

## 2023-12-12 DIAGNOSIS — I10 Essential (primary) hypertension: Secondary | ICD-10-CM

## 2023-12-14 ENCOUNTER — Other Ambulatory Visit: Payer: Self-pay | Admitting: Family

## 2023-12-14 DIAGNOSIS — E1165 Type 2 diabetes mellitus with hyperglycemia: Secondary | ICD-10-CM

## 2024-01-12 ENCOUNTER — Other Ambulatory Visit: Payer: Self-pay | Admitting: Family

## 2024-02-07 ENCOUNTER — Encounter: Payer: Self-pay | Admitting: Family

## 2024-02-07 NOTE — Assessment & Plan Note (Signed)
 Continue current meds.  Will adjust as needed based on results.  The patient is asked to make an attempt to improve diet and exercise patterns to aid in medical management of this problem. Addressed importance of increasing and maintaining water intake.

## 2024-02-07 NOTE — Assessment & Plan Note (Signed)
 Checking labs today. Will call pt. With results  Continue current diabetes POC, as patient has been well controlled on current regimen.  Will adjust meds if needed based on labs.

## 2024-02-07 NOTE — Assessment & Plan Note (Signed)
 Continue current therapy for lipid control. Will modify as needed based on labwork results.

## 2024-02-07 NOTE — Progress Notes (Signed)
 Established Patient Office Visit  Subjective:  Patient ID: Kimberly Benson, female    DOB: 01/22/1966  Age: 58 y.o. MRN: [id number]  Chief Complaint  Patient presents with   Follow-up    Low BP and discuss lab results    Patient is here today for her  follow up.  She has been feeling fairly well since last appointment.   She does have additional concerns to discuss today.  She has been having low blood pressures lately, asks if there is anything specific we need to do.   Labs were done previously, so we will discuss today.  She needs refills.   I have reviewed her active problem list for her appointment today.      No other concerns at this time.   Past Medical History:  Diagnosis Date   Hypertension     Past Surgical History:  Procedure Laterality Date   BREAST EXCISIONAL BIOPSY Left 58 yrs old   benign   COLONOSCOPY WITH PROPOFOL  N/A 11/25/2021   Procedure: COLONOSCOPY WITH PROPOFOL ;  Surgeon: Unk Corinn Skiff, MD;  Location: [facility] END[facility]COPY;  Service: Gastroenterology;  Laterality: N/A;   TOTAL ABDOMINAL HYSTERECTOMY  2015   supracervical    Social History   Socioeconomic History   Marital status: Single    Spouse name: Not on file   Number of children: Not on file   Years of education: Not on file   Highest education level: Not on file  Occupational History   Not on file  Tobacco Use   Smoking status: Never   Smokeless tobacco: Never  Vaping Use   Vaping status: Never Used  Substance and Sexual Activity   Alcohol use: Yes    Comment: Social    Drug use: Never   Sexual activity: Yes    Birth control/protection: None  Other Topics Concern   Not on file  Social History Narrative   Not on file   Social Drivers of Health   Financial Resource Strain: Not on file  Food Insecurity: Not on file  Transportation Needs: Not on file  Physical Activity: Not on file  Stress: Not on file  Social Connections: Not on file  Intimate Partner Violence:  Not on file    Family History  Problem Relation Age of Onset   Breast cancer Neg Hx     No Known Allergies  Review of Systems  Constitutional:  Positive for malaise/fatigue.  Neurological:  Positive for dizziness.  All other systems reviewed and are negative.      Objective:   BP (!) 88/66   Pulse 92   Ht 5' 3 (1.6 m)   Wt 198 lb 3.2 oz (89.9 kg)   SpO2 97%   BMI 35.11 kg/m   Vitals:   11/09/23 1105  BP: (!) 88/66  Pulse: 92  Height: 5' 3 (1.6 m)  Weight: 198 lb 3.2 oz (89.9 kg)  SpO2: 97%  BMI (Calculated): 35.12    Physical Exam Vitals and nursing note reviewed.  Constitutional:      Appearance: Normal appearance. She is normal weight.  HENT:     Head: Normocephalic.  Eyes:     Extraocular Movements: Extraocular movements intact.     Conjunctiva/sclera: Conjunctivae normal.     Pupils: Pupils are equal, round, and reactive to light.  Cardiovascular:     Rate and Rhythm: Normal rate.  Pulmonary:     Effort: Pulmonary effort is normal.  Neurological:     General: No  focal deficit present.     Mental Status: She is alert and oriented to person, place, and time. Mental status is at baseline.  Psychiatric:        Mood and Affect: Mood normal.        Behavior: Behavior normal.        Thought Content: Thought content normal.      No results found for any visits on 11/09/23.  No results found for this or any previous visit (from the past 2160 hours).     Assessment & Plan Type 2 diabetes mellitus with hyperglycemia, without long-term current use of insulin (HCC) Checking labs today. Will call pt. With results  Continue current diabetes POC, as patient has been well controlled on current regimen.  Will adjust meds if needed based on labs.   Class 2 severe obesity due to excess calories with serious comorbidity in adult, unspecified BMI (HCC) Continue current meds.  Will adjust as needed based on results.  The patient is asked to make an  attempt to improve diet and exercise patterns to aid in medical management of this problem. Addressed importance of increasing and maintaining water intake.   Mixed hyperlipidemia Continue current therapy for lipid control. Will modify as needed based on labwork results.   Generalized anxiety disorder Patient stable.  Well controlled with current therapy.   Continue current meds.      No follow-ups on file.   Total time spent: 20 minutes  ALAN CHRISTELLA ARRANT, FNP  11/09/2023   This document may have been prepared by [facility] Voice Recognition software and as such may include unintentional dictation errors.

## 2024-02-15 ENCOUNTER — Ambulatory Visit: Admitting: Family

## 2024-02-15 ENCOUNTER — Encounter: Payer: Self-pay | Admitting: Family

## 2024-02-15 VITALS — BP 128/80 | HR 87 | Ht 63.0 in | Wt 205.2 lb

## 2024-02-15 DIAGNOSIS — E782 Mixed hyperlipidemia: Secondary | ICD-10-CM

## 2024-02-15 DIAGNOSIS — E66812 Obesity, class 2: Secondary | ICD-10-CM

## 2024-02-15 DIAGNOSIS — E1165 Type 2 diabetes mellitus with hyperglycemia: Secondary | ICD-10-CM | POA: Diagnosis not present

## 2024-02-15 DIAGNOSIS — E538 Deficiency of other specified B group vitamins: Secondary | ICD-10-CM

## 2024-02-15 DIAGNOSIS — E559 Vitamin D deficiency, unspecified: Secondary | ICD-10-CM

## 2024-02-15 DIAGNOSIS — I10 Essential (primary) hypertension: Secondary | ICD-10-CM

## 2024-02-15 LAB — GLUCOSE, POCT (MANUAL RESULT ENTRY): POC Glucose: 106 mg/dL — AB (ref 70–99)

## 2024-02-15 MED ORDER — MOUNJARO 10 MG/0.5ML ~~LOC~~ SOAJ: 10.0000 mg | SUBCUTANEOUS | 2 refills | Status: DC

## 2024-02-15 MED ORDER — MOUNJARO 7.5 MG/0.5ML ~~LOC~~ SOAJ: 7.5000 mg | SUBCUTANEOUS | 0 refills | Status: DC

## 2024-02-15 MED ORDER — ALPRAZOLAM 0.25 MG PO TABS: 0.2500 mg | ORAL_TABLET | Freq: Two times a day (BID) | ORAL | 3 refills | Status: AC | PRN

## 2024-02-15 NOTE — Assessment & Plan Note (Signed)
 Checking labs today.  Continue current therapy for lipid control. Will modify as needed based on labwork results.   -CMP w/eGFR -Lipid Panel

## 2024-02-15 NOTE — Assessment & Plan Note (Signed)
 Continue current meds.  Will adjust as needed based on results.  The patient is asked to make an attempt to improve diet and exercise patterns to aid in medical management of this problem. Addressed importance of increasing and maintaining water intake.

## 2024-02-15 NOTE — Progress Notes (Signed)
 Established Patient Office Visit  Subjective:  Patient ID: Kimberly Benson, female    DOB: 06/29/1966  Age: 58 y.o. MRN: [id number]  Chief Complaint  Patient presents with   Follow-up    3 month follow up    Patient is here today for her 3 months follow up.  She has been feeling fairly well since last appointment.   She does have additional concerns to discuss today.  She has been experiencing additional anxiety as a result of her mom coming in to move in with her. She also says that she is concerned that the Mounjaro  may have stopped working as she has gained some weight back.  Labs are due today.  She needs refills.   I have reviewed her active problem list, medication list, allergies, health maintenance, notes from last encounter, lab results for her appointment today.      No other concerns at this time.   Past Medical History:  Diagnosis Date   Hypertension     Past Surgical History:  Procedure Laterality Date   BREAST EXCISIONAL BIOPSY Left 58 yrs old   benign   COLONOSCOPY WITH PROPOFOL  N/A 11/25/2021   Procedure: COLONOSCOPY WITH PROPOFOL ;  Surgeon: Unk Corinn Skiff, MD;  Location: [facility] ENDOSCOPY;  Service: Gastroenterology;  Laterality: N/A;   TOTAL ABDOMINAL HYSTERECTOMY  2015   supracervical    Social History   Socioeconomic History   Marital status: Single    Spouse name: Not on file   Number of children: Not on file   Years of education: Not on file   Highest education level: Not on file  Occupational History   Not on file  Tobacco Use   Smoking status: Never   Smokeless tobacco: Never  Vaping Use   Vaping status: Never Used  Substance and Sexual Activity   Alcohol use: Yes    Comment: Social    Drug use: Never   Sexual activity: Yes    Birth control/protection: None  Other Topics Concern   Not on file  Social History Narrative   Not on file   Social Drivers of Health   Financial Resource Strain: Not on file  Food Insecurity:  Not on file  Transportation Needs: Not on file  Physical Activity: Not on file  Stress: Not on file  Social Connections: Not on file  Intimate Partner Violence: Not on file    Family History  Problem Relation Age of Onset   Breast cancer Neg Hx     No Known Allergies  Review of Systems  All other systems reviewed and are negative.      Objective:   BP 128/80   Pulse 87   Ht 5' 3 (1.6 m)   Wt 205 lb 3.2 oz (93.1 kg)   SpO2 98%   BMI 36.35 kg/m   Vitals:   02/15/24 0856  BP: 128/80  Pulse: 87  Height: 5' 3 (1.6 m)  Weight: 205 lb 3.2 oz (93.1 kg)  SpO2: 98%  BMI (Calculated): 36.36    Physical Exam Vitals and nursing note reviewed.  Constitutional:      Appearance: Normal appearance. She is obese.  HENT:     Head: Normocephalic.  Eyes:     Extraocular Movements: Extraocular movements intact.     Conjunctiva/sclera: Conjunctivae normal.     Pupils: Pupils are equal, round, and reactive to light.  Cardiovascular:     Rate and Rhythm: Normal rate.  Pulmonary:     Effort: Pulmonary  effort is normal.  Neurological:     General: No focal deficit present.     Mental Status: She is alert and oriented to person, place, and time. Mental status is at baseline.  Psychiatric:        Attention and Perception: Attention normal.        Mood and Affect: Affect normal. Mood is anxious.        Speech: Speech normal.        Behavior: Behavior normal.        Thought Content: Thought content normal.        Cognition and Memory: Cognition and memory normal.        Judgment: Judgment normal.      Results for orders placed or performed in visit on 02/15/24  POCT Glucose (CBG)  Result Value Ref Range   POC Glucose 106 (A) 70 - 99 mg/dl    Recent Results (from the past 2160 hours)  POCT Glucose (CBG)     Status: Abnormal   Collection Time: 02/15/24  9:02 AM  Result Value Ref Range   POC Glucose 106 (A) 70 - 99 mg/dl       Assessment & Plan Type 2 diabetes  mellitus with hyperglycemia, without long-term current use of insulin (HCC) Checking labs today. Will call pt. With results  Continue current diabetes POC, as patient has been well controlled on current regimen.  Will adjust meds if needed based on labs.   -CBC w/Diff -CMP w/eGFR -Hemoglobin A1C  Essential hypertension, benign Blood pressure well controlled with current medications.  Continue current therapy.  Will reassess at follow up.   - CBC w/Diff - CMP w/eGFR  Mixed hyperlipidemia Checking labs today.  Continue current therapy for lipid control. Will modify as needed based on labwork results.   -CMP w/eGFR -Lipid Panel  Class 2 severe obesity due to excess calories with serious comorbidity in adult, unspecified BMI (HCC) Continue current meds.  Will adjust as needed based on results.  The patient is asked to make an attempt to improve diet and exercise patterns to aid in medical management of this problem. Addressed importance of increasing and maintaining water intake.   Vitamin D  deficiency, unspecified B12 deficiency due to diet Checking labs today.  Will continue supplements as needed.   - Vitamin D  - Vitamin B12     Return in about 3 months (around 05/17/2024).   Total time spent: 20 minutes  ALAN CHRISTELLA ARRANT, FNP  02/15/2024   This document may have been prepared by [facility] Voice Recognition software and as such may include unintentional dictation errors.

## 2024-02-15 NOTE — Assessment & Plan Note (Signed)
 Checking labs today. Will call pt. With results  Continue current diabetes POC, as patient has been well controlled on current regimen.  Will adjust meds if needed based on labs.   -CBC w/Diff -CMP w/eGFR -Hemoglobin A1C

## 2024-02-16 LAB — HEMOGLOBIN A1C
Est. average glucose Bld gHb Est-mCnc: 111 mg/dL
Hgb A1c MFr Bld: 5.5 % (ref 4.8–5.6)

## 2024-02-16 LAB — LIPID PANEL
Chol/HDL Ratio: 2.2 ratio (ref 0.0–4.4)
Cholesterol, Total: 152 mg/dL (ref 100–199)
HDL: 68 mg/dL (ref >39)
LDL Chol Calc (NIH): 72 mg/dL (ref 0–99)
Triglycerides: 59 mg/dL (ref 0–149)
VLDL Cholesterol Cal: 12 mg/dL (ref 5–40)

## 2024-02-16 LAB — CMP14+EGFR
ALT: 13 IU/L (ref 0–32)
AST: 16 IU/L (ref 0–40)
Albumin: 4.8 g/dL (ref 3.8–4.9)
Alkaline Phosphatase: 56 IU/L (ref 44–121)
BUN/Creatinine Ratio: 20 (ref 9–23)
BUN: 24 mg/dL (ref 6–24)
Bilirubin Total: 0.3 mg/dL (ref 0.0–1.2)
CO2: 22 mmol/L (ref 20–29)
Calcium: 9.9 mg/dL (ref 8.7–10.2)
Chloride: 104 mmol/L (ref 96–106)
Creatinine, Ser: 1.19 mg/dL — ABNORMAL HIGH (ref 0.57–1.00)
Globulin, Total: 2.6 g/dL (ref 1.5–4.5)
Glucose: 93 mg/dL (ref 70–99)
Potassium: 4.2 mmol/L (ref 3.5–5.2)
Sodium: 143 mmol/L (ref 134–144)
Total Protein: 7.4 g/dL (ref 6.0–8.5)
eGFR: 53 mL/min/1.73 — ABNORMAL LOW (ref >59)

## 2024-02-16 LAB — CBC WITH DIFFERENTIAL/PLATELET
Basophils Absolute: 0.1 x10E3/uL (ref 0.0–0.2)
Basos: 1 %
EOS (ABSOLUTE): 0.1 x10E3/uL (ref 0.0–0.4)
Eos: 1 %
Hematocrit: 41.8 % (ref 34.0–46.6)
Hemoglobin: 13 g/dL (ref 11.1–15.9)
Immature Grans (Abs): 0 x10E3/uL (ref 0.0–0.1)
Immature Granulocytes: 0 %
Lymphocytes Absolute: 2.8 x10E3/uL (ref 0.7–3.1)
Lymphs: 36 %
MCH: 28.9 pg (ref 26.6–33.0)
MCHC: 31.1 g/dL — ABNORMAL LOW (ref 31.5–35.7)
MCV: 93 fL (ref 79–97)
Monocytes Absolute: 0.4 x10E3/uL (ref 0.1–0.9)
Monocytes: 5 %
Neutrophils Absolute: 4.3 x10E3/uL (ref 1.4–7.0)
Neutrophils: 57 %
Platelets: 249 x10E3/uL (ref 150–450)
RBC: 4.5 x10E6/uL (ref 3.77–5.28)
RDW: 14.8 % (ref 11.7–15.4)
WBC: 7.7 x10E3/uL (ref 3.4–10.8)

## 2024-02-16 LAB — VITAMIN B12: Vitamin B-12: 468 pg/mL (ref 232–1245)

## 2024-02-16 LAB — TSH: TSH: 1.36 u[IU]/mL (ref 0.450–4.500)

## 2024-02-16 LAB — VITAMIN D 25 HYDROXY (VIT D DEFICIENCY, FRACTURES): Vit D, 25-Hydroxy: 31.2 ng/mL (ref 30.0–100.0)

## 2024-02-21 ENCOUNTER — Ambulatory Visit (HOSPITAL_BASED_OUTPATIENT_CLINIC_OR_DEPARTMENT_OTHER)
Admission: RE | Admit: 2024-02-21 | Discharge: 2024-02-21 | Disposition: A | Discharge status: Home / Self Care | Source: Ambulatory Visit | Attending: Obstetrics and Gynecology | Admitting: Obstetrics and Gynecology

## 2024-02-21 ENCOUNTER — Encounter (HOSPITAL_BASED_OUTPATIENT_CLINIC_OR_DEPARTMENT_OTHER): Payer: Self-pay

## 2024-02-21 DIAGNOSIS — Z1231 Encounter for screening mammogram for malignant neoplasm of breast: Secondary | ICD-10-CM | POA: Diagnosis not present

## 2024-02-21 DIAGNOSIS — Z01419 Encounter for gynecological examination (general) (routine) without abnormal findings: Secondary | ICD-10-CM

## 2024-03-25 ENCOUNTER — Other Ambulatory Visit: Payer: Self-pay | Admitting: Family

## 2024-03-25 DIAGNOSIS — I10 Essential (primary) hypertension: Secondary | ICD-10-CM

## 2024-03-25 DIAGNOSIS — E1165 Type 2 diabetes mellitus with hyperglycemia: Secondary | ICD-10-CM

## 2024-04-15 ENCOUNTER — Other Ambulatory Visit: Payer: Self-pay | Admitting: Family

## 2024-04-15 DIAGNOSIS — I10 Essential (primary) hypertension: Secondary | ICD-10-CM

## 2024-04-19 ENCOUNTER — Other Ambulatory Visit: Payer: Self-pay | Admitting: Family

## 2024-05-16 ENCOUNTER — Other Ambulatory Visit: Payer: Self-pay | Admitting: Family

## 2024-05-23 ENCOUNTER — Other Ambulatory Visit: Payer: Self-pay | Admitting: Family

## 2024-05-23 ENCOUNTER — Encounter: Payer: Self-pay | Admitting: Family

## 2024-05-23 ENCOUNTER — Ambulatory Visit: Admitting: Family

## 2024-05-23 VITALS — BP 110/84 | HR 98 | Ht 63.0 in | Wt 197.0 lb

## 2024-05-23 DIAGNOSIS — E559 Vitamin D deficiency, unspecified: Secondary | ICD-10-CM

## 2024-05-23 DIAGNOSIS — E782 Mixed hyperlipidemia: Secondary | ICD-10-CM

## 2024-05-23 DIAGNOSIS — I10 Essential (primary) hypertension: Secondary | ICD-10-CM

## 2024-05-23 DIAGNOSIS — E1165 Type 2 diabetes mellitus with hyperglycemia: Secondary | ICD-10-CM | POA: Diagnosis not present

## 2024-05-23 DIAGNOSIS — E538 Deficiency of other specified B group vitamins: Secondary | ICD-10-CM

## 2024-05-23 DIAGNOSIS — Z23 Encounter for immunization: Secondary | ICD-10-CM

## 2024-05-23 DIAGNOSIS — Z79899 Other long term (current) drug therapy: Secondary | ICD-10-CM

## 2024-05-23 DIAGNOSIS — E66812 Obesity, class 2: Secondary | ICD-10-CM

## 2024-05-23 LAB — POC CREATINE & ALBUMIN,URINE
Creatinine, POC: 300 mg/dL
Microalbumin Ur, POC: 80 mg/L

## 2024-05-23 LAB — POCT CBG (FASTING - GLUCOSE)-MANUAL ENTRY: Glucose Fasting, POC: 103 mg/dL — AB (ref 70–99)

## 2024-05-23 MED ORDER — DAPAGLIFLOZIN PROPANEDIOL 10 MG PO TABS: 10.0000 mg | ORAL_TABLET | Freq: Every day | ORAL | 2 refills | Status: DC

## 2024-05-23 MED ORDER — OZEMPIC (2 MG/DOSE) 8 MG/3ML ~~LOC~~ SOPN: 2.0000 mg | PEN_INJECTOR | SUBCUTANEOUS | 2 refills | Status: DC

## 2024-05-23 NOTE — Progress Notes (Signed)
 Established Patient Office Visit  Subjective:  Patient ID: Kimberly Benson, female    DOB: Mar 12, 1966  Age: 58 y.o. MRN: [id number]  Chief Complaint  Patient presents with   Follow-up    3 month follow up    Patient is here today for her 3 months follow up.  She has been feeling fairly well since last appointment.   She does have additional concerns to discuss today.   - C/O increasing stress levels due to employment instability and mother living with her. States exhaustion from recent travel to [place]  and returning to work. Expresses concern regarding recent blood glucose levels despite being on Mounjaro . 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 This has created job instability, with current employment guaranteed only until 08/09/2023.  Labs are due today.  She needs refills.   I have reviewed her active problem list, medication list, allergies, health maintenance, notes from last encounter, lab results for her appointment today.      No other concerns at this time.   Past Medical History:  Diagnosis Date   Hypertension     Past Surgical History:  Procedure Laterality Date   BREAST EXCISIONAL BIOPSY Left 58 yrs old   benign   COLONOSCOPY WITH PROPOFOL  N/A 11/25/2021   Procedure: COLONOSCOPY WITH PROPOFOL ;  Surgeon: Unk Corinn Skiff, MD;  Location: [facility] ENDOSCOPY;  Service: Gastroenterology;  Laterality: N/A;   TOTAL ABDOMINAL HYSTERECTOMY  2015   supracervical    Social History   Socioeconomic History   Marital status: Single    Spouse name: Not on file   Number of children: Not on file   Years of education: Not on file   Highest education level: Not on file  Occupational History   Not on file  Tobacco Use   Smoking status: Never   Smokeless tobacco: Never  Vaping Use   Vaping status: Never Used  Substance and Sexual Activity   Alcohol use: Yes    Comment: Social    Drug  use: Never   Sexual activity: Yes    Birth control/protection: None  Other Topics Concern   Not on file  Social History Narrative   Not on file   Social Drivers of Health   Financial Resource Strain: Not on file  Food Insecurity: Not on file  Transportation Needs: Not on file  Physical Activity: Not on file  Stress: Not on file  Social Connections: Not on file  Intimate Partner Violence: Not on file    Family History  Problem Relation Age of Onset   Breast cancer Neg Hx     No Known Allergies  Review of Systems  Constitutional:  Positive for malaise/fatigue.  Psychiatric/Behavioral:  The patient is nervous/anxious.   All other systems reviewed and are negative.      Objective:   BP 110/84   Pulse 98   Ht 5' 3 (1.6 m)   Wt 197 lb (89.4 kg)   SpO2 95%   BMI 34.90 kg/m   Vitals:   05/23/24 0932  BP: 110/84  Pulse: 98  Height: 5' 3 (1.6 m)  Weight: 197 lb (89.4 kg)  SpO2: 95%  BMI (Calculated): 34.91    Physical Exam Vitals and nursing note reviewed.  Constitutional:      Appearance: Normal appearance. She is normal weight.  HENT:     Head: Normocephalic.  Eyes:     Extraocular Movements: Extraocular movements  intact.     Conjunctiva/sclera: Conjunctivae normal.     Pupils: Pupils are equal, round, and reactive to light.  Cardiovascular:     Rate and Rhythm: Normal rate and regular rhythm.     Pulses: Normal pulses.  Pulmonary:     Effort: Pulmonary effort is normal.  Musculoskeletal:        General: Normal range of motion.  Neurological:     General: No focal deficit present.     Mental Status: She is alert and oriented to person, place, and time. Mental status is at baseline.  Psychiatric:        Mood and Affect: Mood normal.        Behavior: Behavior normal.        Thought Content: Thought content normal.        Judgment: Judgment normal.      Results for orders placed or performed in visit on 05/23/24  POCT CBG (Fasting - Glucose)   Result Value Ref Range   Glucose Fasting, POC 103 (A) 70 - 99 mg/dL    Recent Results (from the past 2160 hours)  POCT CBG (Fasting - Glucose)     Status: Abnormal   Collection Time: 05/23/24  9:37 AM  Result Value Ref Range   Glucose Fasting, POC 103 (A) 70 - 99 mg/dL       Assessment & Plan Type 2 diabetes mellitus with hyperglycemia, without long-term current use of insulin (HCC) Checking labs today. Will call pt. With results  Switching patient back to Ozempic , as patient has been well controlled on previous regimen, and her labs were improved.  Will adjust meds if needed based on labs.   -CBC w/Diff -CMP w/eGFR -Hemoglobin A1C  Needs flu shot Flu vaccine given in office today.  Suggested pt manage side effects with supportive therapies.   Essential hypertension, benign Blood pressure well controlled with current medications.  Continue current therapy.  Will reassess at follow up.   - CBC w/Diff - CMP w/eGFR  Mixed hyperlipidemia Checking labs today.  Continue current therapy for lipid control. Will modify as needed based on labwork results.   -CMP w/eGFR -Lipid Panel  Vitamin D  deficiency, unspecified B12 deficiency due to diet Checking labs today.  Will continue supplements as needed.   - Vitamin D  - Vitamin B12 - TSH  Class 2 severe obesity due to excess calories with serious comorbidity in adult, unspecified BMI Continue current meds.  Will adjust as needed based on results.  The patient is asked to make an attempt to improve diet and exercise patterns to aid in medical management of this problem. Addressed importance of increasing and maintaining water intake.      Return in about 3 months (around 08/23/2024).   Total time spent: 30 minutes  ALAN CHRISTELLA ARRANT, FNP  05/23/2024   This document may have been prepared by [facility] Voice Recognition software and as such may include unintentional dictation errors.

## 2024-05-24 ENCOUNTER — Encounter: Payer: Self-pay | Admitting: Family

## 2024-05-24 LAB — LIPID PANEL
Chol/HDL Ratio: 2.2 ratio (ref 0.0–4.4)
Cholesterol, Total: 172 mg/dL (ref 100–199)
HDL: 77 mg/dL (ref >39)
LDL Chol Calc (NIH): 80 mg/dL (ref 0–99)
Triglycerides: 81 mg/dL (ref 0–149)
VLDL Cholesterol Cal: 15 mg/dL (ref 5–40)

## 2024-05-24 LAB — HEMOGLOBIN A1C
Est. average glucose Bld gHb Est-mCnc: 117 mg/dL
Hgb A1c MFr Bld: 5.7 % — ABNORMAL HIGH (ref 4.8–5.6)

## 2024-05-24 LAB — CBC WITH DIFFERENTIAL/PLATELET
Basophils Absolute: 0.1 x10E3/uL (ref 0.0–0.2)
Basos: 1 %
EOS (ABSOLUTE): 0.1 x10E3/uL (ref 0.0–0.4)
Eos: 1 %
Hematocrit: 42.1 % (ref 34.0–46.6)
Hemoglobin: 13.3 g/dL (ref 11.1–15.9)
Immature Grans (Abs): 0 x10E3/uL (ref 0.0–0.1)
Immature Granulocytes: 0 %
Lymphocytes Absolute: 3 x10E3/uL (ref 0.7–3.1)
Lymphs: 39 %
MCH: 29.1 pg (ref 26.6–33.0)
MCHC: 31.6 g/dL (ref 31.5–35.7)
MCV: 92 fL (ref 79–97)
Monocytes Absolute: 0.4 x10E3/uL (ref 0.1–0.9)
Monocytes: 5 %
Neutrophils Absolute: 4.1 x10E3/uL (ref 1.4–7.0)
Neutrophils: 54 %
Platelets: 247 x10E3/uL (ref 150–450)
RBC: 4.57 x10E6/uL (ref 3.77–5.28)
RDW: 16.5 % — ABNORMAL HIGH (ref 11.7–15.4)
WBC: 7.5 x10E3/uL (ref 3.4–10.8)

## 2024-05-24 LAB — CMP14+EGFR
ALT: 13 IU/L (ref 0–32)
AST: 16 IU/L (ref 0–40)
Albumin: 5.1 g/dL — ABNORMAL HIGH (ref 3.8–4.9)
Alkaline Phosphatase: 55 IU/L (ref 49–135)
BUN/Creatinine Ratio: 18 (ref 9–23)
BUN: 22 mg/dL (ref 6–24)
Bilirubin Total: 0.5 mg/dL (ref 0.0–1.2)
CO2: 23 mmol/L (ref 20–29)
Calcium: 9.8 mg/dL (ref 8.7–10.2)
Chloride: 103 mmol/L (ref 96–106)
Creatinine, Ser: 1.22 mg/dL — ABNORMAL HIGH (ref 0.57–1.00)
Globulin, Total: 3 g/dL (ref 1.5–4.5)
Glucose: 96 mg/dL (ref 70–99)
Potassium: 4.3 mmol/L (ref 3.5–5.2)
Sodium: 143 mmol/L (ref 134–144)
Total Protein: 8.1 g/dL (ref 6.0–8.5)
eGFR: 52 mL/min/1.73 — ABNORMAL LOW (ref >59)

## 2024-05-24 LAB — TSH: TSH: 1.52 u[IU]/mL (ref 0.450–4.500)

## 2024-05-24 LAB — VITAMIN B12: Vitamin B-12: 708 pg/mL (ref 232–1245)

## 2024-05-24 LAB — VITAMIN D 25 HYDROXY (VIT D DEFICIENCY, FRACTURES): Vit D, 25-Hydroxy: 30.9 ng/mL (ref 30.0–100.0)

## 2024-05-24 NOTE — Assessment & Plan Note (Signed)
 Checking labs today. Will call pt. With results  Switching patient back to Ozempic , as patient has been well controlled on previous regimen, and her labs were improved.  Will adjust meds if needed based on labs.   -CBC w/Diff -CMP w/eGFR -Hemoglobin A1C

## 2024-05-24 NOTE — Assessment & Plan Note (Signed)
 Continue current meds.  Will adjust as needed based on results.  The patient is asked to make an attempt to improve diet and exercise patterns to aid in medical management of this problem. Addressed importance of increasing and maintaining water  intake.

## 2024-05-24 NOTE — Assessment & Plan Note (Signed)
 Checking labs today.  Continue current therapy for lipid control. Will modify as needed based on labwork results.   -CMP w/eGFR -Lipid Panel

## 2024-05-26 LAB — TOXASSURE SELECT 13 (MW), URINE

## 2024-05-30 ENCOUNTER — Telehealth: Payer: Self-pay | Admitting: Family

## 2024-05-30 NOTE — Telephone Encounter (Signed)
 Patient left VM requesting her lab results. Please advise.

## 2024-06-02 ENCOUNTER — Ambulatory Visit: Payer: Self-pay

## 2024-06-02 NOTE — Telephone Encounter (Signed)
 See my chart message

## 2024-06-27 ENCOUNTER — Other Ambulatory Visit: Payer: Self-pay | Admitting: Family

## 2024-06-27 DIAGNOSIS — E782 Mixed hyperlipidemia: Secondary | ICD-10-CM

## 2024-07-25 ENCOUNTER — Ambulatory Visit: Payer: Self-pay | Admitting: Cardiology

## 2024-07-25 ENCOUNTER — Ambulatory Visit: Admitting: Cardiology

## 2024-07-25 ENCOUNTER — Encounter: Payer: Self-pay | Admitting: Cardiology

## 2024-07-25 VITALS — BP 118/84 | HR 78 | Ht 63.0 in | Wt 192.8 lb

## 2024-07-25 DIAGNOSIS — R10A1 Flank pain, right side: Secondary | ICD-10-CM | POA: Diagnosis not present

## 2024-07-25 DIAGNOSIS — E1169 Type 2 diabetes mellitus with other specified complication: Secondary | ICD-10-CM

## 2024-07-25 DIAGNOSIS — E1159 Type 2 diabetes mellitus with other circulatory complications: Secondary | ICD-10-CM

## 2024-07-25 DIAGNOSIS — N3000 Acute cystitis without hematuria: Secondary | ICD-10-CM

## 2024-07-25 DIAGNOSIS — E782 Mixed hyperlipidemia: Secondary | ICD-10-CM | POA: Diagnosis not present

## 2024-07-25 DIAGNOSIS — I152 Hypertension secondary to endocrine disorders: Secondary | ICD-10-CM

## 2024-07-25 DIAGNOSIS — E1165 Type 2 diabetes mellitus with hyperglycemia: Secondary | ICD-10-CM | POA: Diagnosis not present

## 2024-07-25 DIAGNOSIS — E66812 Obesity, class 2: Secondary | ICD-10-CM

## 2024-07-25 LAB — POCT URINALYSIS DIPSTICK
Bilirubin, UA: NEGATIVE
Glucose, UA: POSITIVE — AB
Ketones, UA: NEGATIVE
Nitrite, UA: NEGATIVE
Protein, UA: POSITIVE — AB
Spec Grav, UA: 1.025
Urobilinogen, UA: 0.2 U/dL
pH, UA: 5.5

## 2024-07-25 MED ORDER — NITROFURANTOIN MONOHYD MACRO 100 MG PO CAPS: 100.0000 mg | ORAL_CAPSULE | Freq: Two times a day (BID) | ORAL | 0 refills | Status: AC

## 2024-07-25 NOTE — Progress Notes (Signed)
 "  Established Patient Office Visit  Subjective:  Patient ID: Kimberly Benson, female    DOB: February 28, 1966  Age: 59 y.o. MRN: [id number]  Chief Complaint  Patient presents with   Acute Visit    Pain in right side started Monday when turning or moving in certain ways it makes it feel a bit worse.     Patient in office for an acute visit, patient complaining of right side pain that started 4 days ago. Pain worse with movement. Denies dysuria. UA today abnormal, will send for culture. Macrobid  sent in. Drink plenty of water.  Blood pressure normal today.   Urinary Tract Infection  This is a new problem. The current episode started in the past 7 days. The problem occurs every urination. The quality of the pain is described as aching. There has been no fever. Associated symptoms include flank pain, frequency and urgency. She has tried nothing for the symptoms. The treatment provided no relief.    No other concerns at this time.   Past Medical History:  Diagnosis Date   Hypertension     Past Surgical History:  Procedure Laterality Date   BREAST EXCISIONAL BIOPSY Left 59 yrs old   benign   COLONOSCOPY WITH PROPOFOL  N/A 11/25/2021   Procedure: COLONOSCOPY WITH PROPOFOL ;  Surgeon: Unk Corinn Skiff, MD;  Location: [facility] ENDOSCOPY;  Service: Gastroenterology;  Laterality: N/A;   TOTAL ABDOMINAL HYSTERECTOMY  2015   supracervical    Social History   Socioeconomic History   Marital status: Single    Spouse name: Not on file   Number of children: Not on file   Years of education: Not on file   Highest education level: Not on file  Occupational History   Not on file  Tobacco Use   Smoking status: Never   Smokeless tobacco: Never  Vaping Use   Vaping status: Never Used  Substance and Sexual Activity   Alcohol use: Yes    Comment: Social    Drug use: Never   Sexual activity: Yes    Birth control/protection: None  Other Topics Concern   Not on file  Social History Narrative    Not on file   Social Drivers of Health   Tobacco Use: Low Risk (07/25/2024)   Patient History    Smoking Tobacco Use: Never    Smokeless Tobacco Use: Never    Passive Exposure: Not on file  Financial Resource Strain: Not on file  Food Insecurity: Not on file  Transportation Needs: Not on file  Physical Activity: Not on file  Stress: Not on file  Social Connections: Not on file  Intimate Partner Violence: Not on file  Depression (PHQ2-9): Medium Risk (11/02/2023)   Depression (PHQ2-9)    PHQ-2 Score: 6  Alcohol Screen: Not on file  Housing: Not on file  Utilities: Not on file  Health Literacy: Not on file    Family History  Problem Relation Age of Onset   Breast cancer Neg Hx     Allergies[1]  Show/hide medication list[2]  Review of Systems  Constitutional: Negative.   HENT: Negative.    Eyes: Negative.   Respiratory: Negative.  Negative for shortness of breath.   Cardiovascular: Negative.  Negative for chest pain.  Gastrointestinal: Negative.  Negative for abdominal pain, constipation and diarrhea.  Genitourinary:  Positive for flank pain, frequency and urgency.  Musculoskeletal:  Negative for joint pain and myalgias.  Skin: Negative.   Neurological: Negative.  Negative for dizziness and headaches.  Endo/Heme/Allergies: Negative.   All other systems reviewed and are negative.      Objective:   BP 118/84   Pulse 78   Ht 5' 3 (1.6 m)   Wt 192 lb 12.8 oz (87.5 kg)   SpO2 98%   BMI 34.15 kg/m   Vitals:   07/25/24 1029  BP: 118/84  Pulse: 78  Height: 5' 3 (1.6 m)  Weight: 192 lb 12.8 oz (87.5 kg)  SpO2: 98%  BMI (Calculated): 34.16    Physical Exam Vitals and nursing note reviewed.  Constitutional:      Appearance: Normal appearance. She is normal weight.  HENT:     Head: Normocephalic and atraumatic.     Nose: Nose normal.     Mouth/Throat:     Mouth: Mucous membranes are moist.  Eyes:     Extraocular Movements: Extraocular movements  intact.     Conjunctiva/sclera: Conjunctivae normal.     Pupils: Pupils are equal, round, and reactive to light.  Cardiovascular:     Rate and Rhythm: Normal rate and regular rhythm.     Pulses: Normal pulses.     Heart sounds: Normal heart sounds.  Pulmonary:     Effort: Pulmonary effort is normal.     Breath sounds: Normal breath sounds.  Abdominal:     General: Abdomen is flat. Bowel sounds are normal.     Palpations: Abdomen is soft.  Musculoskeletal:        General: Normal range of motion.     Cervical back: Normal range of motion.  Skin:    General: Skin is warm and dry.  Neurological:     General: No focal deficit present.     Mental Status: She is alert and oriented to person, place, and time.  Psychiatric:        Mood and Affect: Mood normal.        Behavior: Behavior normal.        Thought Content: Thought content normal.        Judgment: Judgment normal.      Results for orders placed or performed in visit on 07/25/24  POCT Urinalysis Dipstick (81002)  Result Value Ref Range   Color, UA     Clarity, UA     Glucose, UA Positive (A) Negative   Bilirubin, UA Negative    Ketones, UA Negative    Spec Grav, UA 1.025 1.010 - 1.025   Blood, UA 1+    pH, UA 5.5 5.0 - 8.0   Protein, UA Positive (A) Negative   Urobilinogen, UA 0.2 0.2 or 1.0 E.U./dL   Nitrite, UA Negative    Leukocytes, UA Moderate (2+) (A) Negative   Appearance     Odor      Recent Results (from the past 2160 hours)  POCT CBG (Fasting - Glucose)     Status: Abnormal   Collection Time: 05/23/24  9:37 AM  Result Value Ref Range   Glucose Fasting, POC 103 (A) 70 - 99 mg/dL  POC CREATINE & ALBUMIN,URINE     Status: Abnormal   Collection Time: 05/23/24 10:30 AM  Result Value Ref Range   Microalbumin Ur, POC 80 mg/L   Creatinine, POC 300 mg/dL   Albumin/Creatinine Ratio, Urine, POC 30-300   CMP14+EGFR     Status: Abnormal   Collection Time: 05/23/24 10:45 AM  Result Value Ref Range   Glucose  96 70 - 99 mg/dL   BUN 22 6 - 24 mg/dL   Creatinine,  Ser 1.22 (H) 0.57 - 1.00 mg/dL   eGFR 52 (L) >40 fO/fpw/8.26   BUN/Creatinine Ratio 18 9 - 23   Sodium 143 134 - 144 mmol/L   Potassium 4.3 3.5 - 5.2 mmol/L   Chloride 103 96 - 106 mmol/L   CO2 23 20 - 29 mmol/L   Calcium 9.8 8.7 - 10.2 mg/dL   Total Protein 8.1 6.0 - 8.5 g/dL   Albumin 5.1 (H) 3.8 - 4.9 g/dL   Globulin, Total 3.0 1.5 - 4.5 g/dL   Bilirubin Total 0.5 0.0 - 1.2 mg/dL   Alkaline Phosphatase 55 49 - 135 IU/L   AST 16 0 - 40 IU/L   ALT 13 0 - 32 IU/L  Lipid panel     Status: None   Collection Time: 05/23/24 10:45 AM  Result Value Ref Range   Cholesterol, Total 172 100 - 199 mg/dL   Triglycerides 81 0 - 149 mg/dL   HDL 77 >60 mg/dL   VLDL Cholesterol Cal 15 5 - 40 mg/dL   LDL Chol Calc (NIH) 80 0 - 99 mg/dL   Chol/HDL Ratio 2.2 0.0 - 4.4 ratio    Comment:                                   T. Chol/HDL Ratio                                             Men  Women                               1/2 Avg.Risk  3.4    3.3                                   Avg.Risk  5.0    4.4                                2X Avg.Risk  9.6    7.1                                3X Avg.Risk 23.4   11.0   Vitamin D  (25 hydroxy)     Status: None   Collection Time: 05/23/24 10:45 AM  Result Value Ref Range   Vit D, 25-Hydroxy 30.9 30.0 - 100.0 ng/mL    Comment: Vitamin D  deficiency has been defined by the Institute of Medicine and an Endocrine Society practice guideline as a level of serum 25-OH vitamin D  less than 20 ng/mL (1,2). The Endocrine Society went on to further define vitamin D  insufficiency as a level between 21 and 29 ng/mL (2). 1. IOM (Institute of Medicine). 2010. Dietary reference    intakes for calcium and D. [place]: The    [employer]. 2. Holick MF, Binkley [place], Bischoff-Ferrari HA, et al.    Evaluation, treatment, and prevention of vitamin D     deficiency: an Endocrine Society clinical practice     guideline. JCEM. 2011 Jul; 96(7):1911-30.   Vitamin B12     Status: None  Collection Time: 05/23/24 10:45 AM  Result Value Ref Range   Vitamin B-12 708 232 - 1,245 pg/mL  CBC with Diff     Status: Abnormal   Collection Time: 05/23/24 10:45 AM  Result Value Ref Range   WBC 7.5 3.4 - 10.8 x10E3/uL   RBC 4.57 3.77 - 5.28 x10E6/uL   Hemoglobin 13.3 11.1 - 15.9 g/dL   Hematocrit 57.8 65.9 - 46.6 %   MCV 92 79 - 97 fL   MCH 29.1 26.6 - 33.0 pg   MCHC 31.6 31.5 - 35.7 g/dL   RDW 83.4 (H) 88.2 - 84.5 %   Platelets 247 150 - 450 x10E3/uL   Neutrophils 54 Not Estab. %   Lymphs 39 Not Estab. %   Monocytes 5 Not Estab. %   Eos 1 Not Estab. %   Basos 1 Not Estab. %   Neutrophils Absolute 4.1 1.4 - 7.0 x10E3/uL   Lymphocytes Absolute 3.0 0.7 - 3.1 x10E3/uL   Monocytes Absolute 0.4 0.1 - 0.9 x10E3/uL   EOS (ABSOLUTE) 0.1 0.0 - 0.4 x10E3/uL   Basophils Absolute 0.1 0.0 - 0.2 x10E3/uL   Immature Granulocytes 0 Not Estab. %   Immature Grans (Abs) 0.0 0.0 - 0.1 x10E3/uL  Hemoglobin A1c     Status: Abnormal   Collection Time: 05/23/24 10:45 AM  Result Value Ref Range   Hgb A1c MFr Bld 5.7 (H) 4.8 - 5.6 %    Comment:          Prediabetes: 5.7 - 6.4          Diabetes: >6.4          Glycemic control for adults with diabetes: <7.0    Est. average glucose Bld gHb Est-mCnc 117 mg/dL  TSH     Status: None   Collection Time: 05/23/24 10:45 AM  Result Value Ref Range   TSH 1.520 0.450 - 4.500 uIU/mL  ToxASSURE Select 13 (MW), Urine     Status: None   Collection Time: 05/23/24  3:38 PM  Result Value Ref Range   Summary FINAL     Comment: ==================================================================== ToxASSURE Select 13 (MW) ==================================================================== Test                             Result       Flag       Units  Drug Present   Carboxy-THC                    16                      ng/mg creat    Carboxy-THC is a metabolite of  tetrahydrocannabinol (THC). Source of    THC is most commonly herbal marijuana or marijuana-based products,    but THC is also present in a scheduled prescription medication.    Trace amounts of THC can be present in hemp and cannabidiol (CBD)    products. This test is not intended to distinguish between delta-9-    tetrahydrocannabinol, the predominant form of THC in most herbal or    marijuana-based products, and delta-8-tetrahydrocannabinol.  ==================================================================== Test                      Result    Flag   Units      Ref Range   Creatinine  200              mg/dL      >=2 0 ==================================================================== Declared Medications:  Medication list was not provided. ==================================================================== For clinical consultation, please call [phone]. ====================================================================   POCT Urinalysis Dipstick (18997)     Status: Abnormal   Collection Time: 07/25/24 10:50 AM  Result Value Ref Range   Color, UA     Clarity, UA     Glucose, UA Positive (A) Negative   Bilirubin, UA Negative    Ketones, UA Negative    Spec Grav, UA 1.025 1.010 - 1.025   Blood, UA 1+    pH, UA 5.5 5.0 - 8.0   Protein, UA Positive (A) Negative   Urobilinogen, UA 0.2 0.2 or 1.0 E.U./dL   Nitrite, UA Negative    Leukocytes, UA Moderate (2+) (A) Negative   Appearance     Odor        Assessment & Plan:  Macrobid  Urine culture  Problem List Items Addressed This Visit       Cardiovascular and Mediastinum   Hypertension associated with diabetes (HCC)     Endocrine   Type 2 diabetes mellitus with hyperglycemia (HCC)   Combined hyperlipidemia associated with type 2 diabetes mellitus (HCC)     Genitourinary   Acute cystitis without hematuria - Primary     Other   Obesity   Other Visit Diagnoses       Right flank pain        Relevant Orders   POCT Urinalysis Dipstick (18997) (Completed)   Urine Culture       Return if symptoms worsen or fail to improve, for as scheduled.   Total time spent: 25 minutes. This time includes review of previous notes and results and patient face to face interaction during today's visit.    Jeoffrey Pollen, NP  07/25/2024   This document may have been prepared by Dragon Voice Recognition software and as such may include unintentional dictation errors.     [1] No Known Allergies [2]  Outpatient Medications Prior to Visit  Medication Sig   ALPRAZolam  (XANAX ) 0.25 MG tablet Take 1 tablet (0.25 mg total) by mouth 2 (two) times daily as needed for anxiety.   clindamycin-benzoyl peroxide (BENZACLIN) gel APPLY TO AFFECTED AREAS TWICE DAILY AS NEEDED FOR ACNE BREAKOUTS   dapagliflozin  propanediol (FARXIGA ) 10 MG TABS tablet Take 1 tablet (10 mg total) by mouth daily.   olmesartan-hydrochlorothiazide (BENICAR HCT) 40-25 MG tablet TAKE 1 TABLET BY MOUTH EVERY DAY FOR HIGH BLOOD PRESSURE   rosuvastatin (CRESTOR) 40 MG tablet TAKE 1 TABLET BY MOUTH EVERY DAY   Semaglutide , 2 MG/DOSE, (OZEMPIC , 2 MG/DOSE,) 8 MG/3ML SOPN INJECT 2MG  SUBCUTANEOUSLY  WEEKLY.   No facility-administered medications prior to visit.   "

## 2024-07-30 LAB — URINE CULTURE

## 2024-08-08 ENCOUNTER — Ambulatory Visit: Admitting: Family

## 2024-08-08 ENCOUNTER — Encounter: Payer: Self-pay | Admitting: Family

## 2024-08-08 VITALS — BP 112/84 | HR 90 | Ht 63.0 in | Wt 196.0 lb

## 2024-08-08 DIAGNOSIS — R300 Dysuria: Secondary | ICD-10-CM

## 2024-08-08 DIAGNOSIS — E1159 Type 2 diabetes mellitus with other circulatory complications: Secondary | ICD-10-CM

## 2024-08-08 DIAGNOSIS — I152 Hypertension secondary to endocrine disorders: Secondary | ICD-10-CM

## 2024-08-08 DIAGNOSIS — E1165 Type 2 diabetes mellitus with hyperglycemia: Secondary | ICD-10-CM | POA: Diagnosis not present

## 2024-08-08 DIAGNOSIS — G4762 Sleep related leg cramps: Secondary | ICD-10-CM | POA: Diagnosis not present

## 2024-08-08 LAB — POCT URINALYSIS DIPSTICK
Glucose, UA: POSITIVE — AB
Nitrite, UA: NEGATIVE
Protein, UA: POSITIVE — AB
Spec Grav, UA: 1.02
Urobilinogen, UA: 1 U/dL
pH, UA: 5.5

## 2024-08-08 MED ORDER — FARXIGA 10 MG PO TABS: 10.0000 mg | ORAL_TABLET | Freq: Every day | ORAL | 2 refills | Status: AC

## 2024-08-08 MED ORDER — CEFUROXIME AXETIL 500 MG PO TABS: 500.0000 mg | ORAL_TABLET | Freq: Two times a day (BID) | ORAL | 0 refills | Status: AC

## 2024-08-08 MED ORDER — FLUCONAZOLE 150 MG PO TABS: 150.0000 mg | ORAL_TABLET | ORAL | 0 refills | Status: AC

## 2024-08-08 NOTE — Assessment & Plan Note (Signed)
 Continue current diabetes POC, as patient has been well controlled on current regimen.  Will adjust meds if needed based on labs.

## 2024-08-08 NOTE — Assessment & Plan Note (Signed)
 Blood pressure well controlled with current medications.  Continue current therapy.  Will reassess at follow up.   - CBC w/Diff - CMP w/eGFR

## 2024-08-08 NOTE — Progress Notes (Signed)
 "  Established Patient Office Visit  Subjective:  Patient ID: Kimberly Benson, female    DOB: September 16, 1965  Age: 59 y.o. MRN: [id number]  Chief Complaint  Patient presents with   Follow-up    3 month follow up    Patient is here today for her 3 months follow up.  She has been feeling fairly well since last appointment.   She does have additional concerns to discuss today.  She thinks that she may still have a UTI, and she needs an additional antibiotic.   Labs are not due today.  She needs refills.   I have reviewed her active problem list, medication list, allergies, health maintenance, notes from last encounter, lab results for her appointment today.     No other concerns at this time.   Past Medical History:  Diagnosis Date   Hypertension     Past Surgical History:  Procedure Laterality Date   BREAST EXCISIONAL BIOPSY Left 59 yrs old   benign   COLONOSCOPY WITH PROPOFOL  N/A 11/25/2021   Procedure: COLONOSCOPY WITH PROPOFOL ;  Surgeon: Unk Corinn Skiff, MD;  Location: [facility] ENDOSCOPY;  Service: Gastroenterology;  Laterality: N/A;   TOTAL ABDOMINAL HYSTERECTOMY  2015   supracervical    Social History   Socioeconomic History   Marital status: Single    Spouse name: Not on file   Number of children: Not on file   Years of education: Not on file   Highest education level: Not on file  Occupational History   Not on file  Tobacco Use   Smoking status: Never   Smokeless tobacco: Never  Vaping Use   Vaping status: Never Used  Substance and Sexual Activity   Alcohol use: Yes    Comment: Social    Drug use: Never   Sexual activity: Yes    Birth control/protection: None  Other Topics Concern   Not on file  Social History Narrative   Not on file   Social Drivers of Health   Tobacco Use: Low Risk (08/08/2024)   Patient History    Smoking Tobacco Use: Never    Smokeless Tobacco Use: Never    Passive Exposure: Not on file  Financial Resource Strain: Not on  file  Food Insecurity: Not on file  Transportation Needs: Not on file  Physical Activity: Not on file  Stress: Not on file  Social Connections: Not on file  Intimate Partner Violence: Not on file  Depression (EYV7-0): Medium Risk (11/02/2023)   Depression (PHQ2-9)    PHQ-2 Score: 6  Alcohol Screen: Not on file  Housing: Not on file  Utilities: Not on file  Health Literacy: Not on file    Family History  Problem Relation Age of Onset   Breast cancer Neg Hx     Allergies[1]  Review of Systems  All other systems reviewed and are negative.      Objective:   BP 112/84   Pulse 90   Ht 5' 3 (1.6 m)   Wt 196 lb (88.9 kg)   SpO2 96%   BMI 34.72 kg/m   Vitals:   08/08/24 0910  BP: 112/84  Pulse: 90  Height: 5' 3 (1.6 m)  Weight: 196 lb (88.9 kg)  SpO2: 96%  BMI (Calculated): 34.73    Physical Exam Vitals and nursing note reviewed.  Constitutional:      Appearance: Normal appearance. She is normal weight.  HENT:     Head: Normocephalic.  Eyes:     Extraocular Movements:  Extraocular movements intact.     Conjunctiva/sclera: Conjunctivae normal.     Pupils: Pupils are equal, round, and reactive to light.  Cardiovascular:     Rate and Rhythm: Normal rate.  Pulmonary:     Effort: Pulmonary effort is normal.  Neurological:     General: No focal deficit present.     Mental Status: She is alert and oriented to person, place, and time. Mental status is at baseline.  Psychiatric:        Mood and Affect: Mood normal.        Behavior: Behavior normal.        Thought Content: Thought content normal.      Results for orders placed or performed in visit on 08/08/24  POCT Urinalysis Dipstick (81002)  Result Value Ref Range   Color, UA Yellow    Clarity, UA Cloudy    Glucose, UA Positive (A) Negative   Bilirubin, UA Small    Ketones, UA Trace    Spec Grav, UA 1.020 1.010 - 1.025   Blood, UA Trace    pH, UA 5.5 5.0 - 8.0   Protein, UA Positive (A) Negative    Urobilinogen, UA 1.0 0.2 or 1.0 E.U./dL   Nitrite, UA Negative    Leukocytes, UA Small (1+) (A) Negative   Appearance Cloudy    Odor Yes     Recent Results (from the past 2160 hours)  POCT CBG (Fasting - Glucose)     Status: Abnormal   Collection Time: 05/23/24  9:37 AM  Result Value Ref Range   Glucose Fasting, POC 103 (A) 70 - 99 mg/dL  POC CREATINE & ALBUMIN,URINE     Status: Abnormal   Collection Time: 05/23/24 10:30 AM  Result Value Ref Range   Microalbumin Ur, POC 80 mg/L   Creatinine, POC 300 mg/dL   Albumin/Creatinine Ratio, Urine, POC 30-300   CMP14+EGFR     Status: Abnormal   Collection Time: 05/23/24 10:45 AM  Result Value Ref Range   Glucose 96 70 - 99 mg/dL   BUN 22 6 - 24 mg/dL   Creatinine, Ser 8.77 (H) 0.57 - 1.00 mg/dL   eGFR 52 (L) >40 fO/fpw/8.26   BUN/Creatinine Ratio 18 9 - 23   Sodium 143 134 - 144 mmol/L   Potassium 4.3 3.5 - 5.2 mmol/L   Chloride 103 96 - 106 mmol/L   CO2 23 20 - 29 mmol/L   Calcium 9.8 8.7 - 10.2 mg/dL   Total Protein 8.1 6.0 - 8.5 g/dL   Albumin 5.1 (H) 3.8 - 4.9 g/dL   Globulin, Total 3.0 1.5 - 4.5 g/dL   Bilirubin Total 0.5 0.0 - 1.2 mg/dL   Alkaline Phosphatase 55 49 - 135 IU/L   AST 16 0 - 40 IU/L   ALT 13 0 - 32 IU/L  Lipid panel     Status: None   Collection Time: 05/23/24 10:45 AM  Result Value Ref Range   Cholesterol, Total 172 100 - 199 mg/dL   Triglycerides 81 0 - 149 mg/dL   HDL 77 >60 mg/dL   VLDL Cholesterol Cal 15 5 - 40 mg/dL   LDL Chol Calc (NIH) 80 0 - 99 mg/dL   Chol/HDL Ratio 2.2 0.0 - 4.4 ratio    Comment:                                   T. Chol/HDL Ratio  Men  Women                               1/2 Avg.Risk  3.4    3.3                                   Avg.Risk  5.0    4.4                                2X Avg.Risk  9.6    7.1                                3X Avg.Risk 23.4   11.0   Vitamin D  (25 hydroxy)     Status: None   Collection Time: 05/23/24  10:45 AM  Result Value Ref Range   Vit D, 25-Hydroxy 30.9 30.0 - 100.0 ng/mL    Comment: Vitamin D  deficiency has been defined by the Institute of Medicine and an Endocrine Society practice guideline as a level of serum 25-OH vitamin D  less than 20 ng/mL (1,2). The Endocrine Society went on to further define vitamin D  insufficiency as a level between 21 and 29 ng/mL (2). 1. IOM (Institute of Medicine). 2010. Dietary reference    intakes for calcium and D. [place]: The    [employer]. 2. Holick MF, Binkley [place], Bischoff-Ferrari HA, et al.    Evaluation, treatment, and prevention of vitamin D     deficiency: an Endocrine Society clinical practice    guideline. JCEM. 2011 Jul; 96(7):1911-30.   Vitamin B12     Status: None   Collection Time: 05/23/24 10:45 AM  Result Value Ref Range   Vitamin B-12 708 232 - 1,245 pg/mL  CBC with Diff     Status: Abnormal   Collection Time: 05/23/24 10:45 AM  Result Value Ref Range   WBC 7.5 3.4 - 10.8 x10E3/uL   RBC 4.57 3.77 - 5.28 x10E6/uL   Hemoglobin 13.3 11.1 - 15.9 g/dL   Hematocrit 57.8 65.9 - 46.6 %   MCV 92 79 - 97 fL   MCH 29.1 26.6 - 33.0 pg   MCHC 31.6 31.5 - 35.7 g/dL   RDW 83.4 (H) 88.2 - 84.5 %   Platelets 247 150 - 450 x10E3/uL   Neutrophils 54 Not Estab. %   Lymphs 39 Not Estab. %   Monocytes 5 Not Estab. %   Eos 1 Not Estab. %   Basos 1 Not Estab. %   Neutrophils Absolute 4.1 1.4 - 7.0 x10E3/uL   Lymphocytes Absolute 3.0 0.7 - 3.1 x10E3/uL   Monocytes Absolute 0.4 0.1 - 0.9 x10E3/uL   EOS (ABSOLUTE) 0.1 0.0 - 0.4 x10E3/uL   Basophils Absolute 0.1 0.0 - 0.2 x10E3/uL   Immature Granulocytes 0 Not Estab. %   Immature Grans (Abs) 0.0 0.0 - 0.1 x10E3/uL  Hemoglobin A1c     Status: Abnormal   Collection Time: 05/23/24 10:45 AM  Result Value Ref Range   Hgb A1c MFr Bld 5.7 (H) 4.8 - 5.6 %    Comment:          Prediabetes: 5.7 - 6.4          Diabetes: >6.4  Glycemic control for adults with diabetes:  <7.0    Est. average glucose Bld gHb Est-mCnc 117 mg/dL  TSH     Status: None   Collection Time: 05/23/24 10:45 AM  Result Value Ref Range   TSH 1.520 0.450 - 4.500 uIU/mL  ToxASSURE Select 13 (MW), Urine     Status: None   Collection Time: 05/23/24  3:38 PM  Result Value Ref Range   Summary FINAL     Comment: ==================================================================== ToxASSU[employer] Select 13 (MW) ==================================================================== Test                             Result       Flag       Units  Drug Present   Carboxy-THC                    16                      ng/mg creat    Carboxy-THC is a metabolite of tetrahydrocannabinol (THC). Source of    THC is most commonly herbal marijuana or marijuana-based products,    but THC is also present in a scheduled prescription medication.    Trace amounts of THC can be present in hemp and cannabidiol (CBD)    products. This test is not intended to distinguish between delta-9-    tetrahydrocannabinol, the predominant form of THC in most herbal or    marijuana-based products, and delta-8-tetrahydrocannabinol.  ==================================================================== Test                      Result    Flag   Units      Ref Range   Creatinine              200              mg/dL      >=2 0 ==================================================================== Declared Medications:  Medication list was not provided. ==================================================================== For clinical consultation, please call [phone]. ====================================================================   POCT Urinalysis Dipstick (18997)     Status: Abnormal   Collection Time: 07/25/24 10:50 AM  Result Value Ref Range   Color, UA     Clarity, UA     Glucose, UA Positive (A) Negative   Bilirubin, UA Negative    Ketones, UA Negative    Spec Grav, UA 1.025 1.010 - 1.025   Blood, UA 1+     pH, UA 5.5 5.0 - 8.0   Protein, UA Positive (A) Negative   Urobilinogen, UA 0.2 0.2 or 1.0 E.U./dL   Nitrite, UA Negative    Leukocytes, UA Moderate (2+) (A) Negative   Appearance     Odor    Urine Culture     Status: Abnormal   Collection Time: 07/25/24 11:12 AM   Specimen: Urine   UR  Result Value Ref Range   Urine Culture, Routine Final report (A)    Organism ID, Bacteria Klebsiella pneumoniae (A)     Comment: Cefazolin with an MIC <=16 predicts susceptibility to the oral agents cefaclor, cefdinir, cefpodoxime, cefprozil, cefuroxime , cephalexin, and loracarbef when used for therapy of uncomplicated urinary tract infections due to E. coli, Klebsiella pneumoniae, and Proteus mirabilis. 50,000-100,000 colony forming units per mL    ORGANISM ID, BACTERIA Not applicable    Antimicrobial Susceptibility Comment     Comment:       ** S =  Susceptible; I = Intermediate; R = Resistant **                    P = Positive; N = Negative             MICS are expressed in micrograms per mL    Antibiotic                 RSLT#1    RSLT#2    RSLT#3    RSLT#4 Amoxicillin/Clavulanic Acid    S Ampicillin                     R Cefazolin                      S Cefepime                       S Cefoxitin                      S Cefpodoxime                    S Ceftriaxone                    S Ciprofloxacin                   S Ertapenem                      S Gentamicin                     S Levofloxacin                   S Meropenem                      S Nitrofurantoin                  S Piperacillin/Tazobactam        S Tetracycline                   S Tobramycin                     S Trimethoprim/Sulfa             S   POCT Urinalysis Dipstick (18997)     Status: Abnormal   Collection Time: 08/08/24  9:26 AM  Result Value Ref Range   Color, UA Yellow    Clarity, UA Cloudy    Glucose, UA Positive (A) Negative   Bilirubin, UA Small    Ketones, UA Trace    Spec Grav, UA 1.020 1.010 - 1.025    Blood, UA Trace    pH, UA 5.5 5.0 - 8.0   Protein, UA Positive (A) Negative   Urobilinogen, UA 1.0 0.2 or 1.0 E.U./dL   Nitrite, UA Negative    Leukocytes, UA Small (1+) (A) Negative   Appearance Cloudy    Odor Yes        Assessment & Plan Dysuria UA in office today abnormal.  Sending for repeat UC.  Also sending an additional antibiotic.   Hypertension associated with diabetes (HCC) Blood pressure well controlled with current medications.  Continue current therapy.  Will reassess at follow up.   - CBC w/Diff - CMP w/eGFR  Leg cramps, sleep related Checking CMP,  CBC, Magnesium and Iron levels today.  Will contact pt with results when they are available.   Type 2 diabetes mellitus with hyperglycemia, without long-term current use of insulin (HCC) Continue current diabetes POC, as patient has been well controlled on current regimen.  Will adjust meds if needed based on labs.      Return in about 3 months (around 11/06/2024).   Total time spent: 20 minutes  ALAN CHRISTELLA ARRANT, FNP  08/08/2024   This document may have been prepared by [facility] Voice Recognition software and as such may include unintentional dictation errors.      [1] No Known Allergies  "

## 2024-08-09 LAB — COMPREHENSIVE METABOLIC PANEL WITH GFR
ALT: 14 [IU]/L (ref 0–32)
AST: 20 [IU]/L (ref 0–40)
Albumin: 5.1 g/dL — ABNORMAL HIGH (ref 3.8–4.9)
Alkaline Phosphatase: 55 [IU]/L (ref 49–135)
BUN/Creatinine Ratio: 20 (ref 9–23)
BUN: 26 mg/dL — ABNORMAL HIGH (ref 6–24)
Bilirubin Total: 0.5 mg/dL (ref 0.0–1.2)
CO2: 26 mmol/L (ref 20–29)
Calcium: 10 mg/dL (ref 8.7–10.2)
Chloride: 101 mmol/L (ref 96–106)
Creatinine, Ser: 1.31 mg/dL — ABNORMAL HIGH (ref 0.57–1.00)
Globulin, Total: 2.7 g/dL (ref 1.5–4.5)
Glucose: 93 mg/dL (ref 70–99)
Potassium: 3.9 mmol/L (ref 3.5–5.2)
Sodium: 142 mmol/L (ref 134–144)
Total Protein: 7.8 g/dL (ref 6.0–8.5)
eGFR: 47 mL/min/{1.73_m2} — ABNORMAL LOW

## 2024-08-09 LAB — CBC WITH DIFFERENTIAL/PLATELET
Basophils Absolute: 0.1 10*3/uL (ref 0.0–0.2)
Basos: 1 %
EOS (ABSOLUTE): 0.1 10*3/uL (ref 0.0–0.4)
Eos: 1 %
Hematocrit: 41 % (ref 34.0–46.6)
Hemoglobin: 13.5 g/dL (ref 11.1–15.9)
Immature Grans (Abs): 0 10*3/uL (ref 0.0–0.1)
Immature Granulocytes: 0 %
Lymphocytes Absolute: 2.7 10*3/uL (ref 0.7–3.1)
Lymphs: 39 %
MCH: 29.9 pg (ref 26.6–33.0)
MCHC: 32.9 g/dL (ref 31.5–35.7)
MCV: 91 fL (ref 79–97)
Monocytes Absolute: 0.4 10*3/uL (ref 0.1–0.9)
Monocytes: 5 %
Neutrophils Absolute: 3.8 10*3/uL (ref 1.4–7.0)
Neutrophils: 54 %
Platelets: 278 10*3/uL (ref 150–450)
RBC: 4.51 x10E6/uL (ref 3.77–5.28)
RDW: 15.8 % — ABNORMAL HIGH (ref 11.7–15.4)
WBC: 7 10*3/uL (ref 3.4–10.8)

## 2024-08-09 LAB — MAGNESIUM: Magnesium: 2.3 mg/dL (ref 1.6–2.3)

## 2024-08-12 LAB — URINE CULTURE

## 2024-08-14 ENCOUNTER — Other Ambulatory Visit: Payer: Self-pay

## 2024-08-14 ENCOUNTER — Ambulatory Visit: Payer: Self-pay

## 2024-08-14 DIAGNOSIS — E1159 Type 2 diabetes mellitus with other circulatory complications: Secondary | ICD-10-CM

## 2024-08-14 MED ORDER — OLMESARTAN MEDOXOMIL 40 MG PO TABS: 40.0000 mg | ORAL_TABLET | Freq: Every day | ORAL | 0 refills | Status: AC

## 2024-11-07 ENCOUNTER — Ambulatory Visit: Admitting: Family
